# Patient Record
Sex: Male | Born: 1935 | Race: White | Hispanic: No | Marital: Married | Smoking: Never smoker
Health system: Southern US, Community
[De-identification: ages and names within clinical notes are randomized; demographics above are authoritative.]

## PROBLEM LIST (undated history)

## (undated) DIAGNOSIS — I774 Celiac artery compression syndrome: Secondary | ICD-10-CM

## (undated) DIAGNOSIS — I771 Stricture of artery: Secondary | ICD-10-CM

## (undated) DIAGNOSIS — E042 Nontoxic multinodular goiter: Secondary | ICD-10-CM

## (undated) HISTORY — DX: Nontoxic multinodular goiter: E04.2

## (undated) HISTORY — PX: TONSILLECTOMY AND ADENOIDECTOMY: SUR1326

## (undated) HISTORY — DX: Celiac artery compression syndrome: I77.4

## (undated) HISTORY — DX: Stricture of artery: I77.1

## (undated) HISTORY — PX: KNEE SURGERY: SHX244

## (undated) HISTORY — PX: HERNIA REPAIR: SHX51

---

## 2012-03-21 DIAGNOSIS — H3530 Unspecified macular degeneration: Secondary | ICD-10-CM | POA: Insufficient documentation

## 2012-09-14 DIAGNOSIS — R634 Abnormal weight loss: Secondary | ICD-10-CM | POA: Insufficient documentation

## 2012-11-19 DIAGNOSIS — D72819 Decreased white blood cell count, unspecified: Secondary | ICD-10-CM | POA: Insufficient documentation

## 2012-11-19 DIAGNOSIS — R109 Unspecified abdominal pain: Secondary | ICD-10-CM | POA: Insufficient documentation

## 2012-11-19 DIAGNOSIS — R413 Other amnesia: Secondary | ICD-10-CM | POA: Insufficient documentation

## 2012-11-19 DIAGNOSIS — R21 Rash and other nonspecific skin eruption: Secondary | ICD-10-CM | POA: Insufficient documentation

## 2012-11-19 DIAGNOSIS — L309 Dermatitis, unspecified: Secondary | ICD-10-CM | POA: Insufficient documentation

## 2013-10-18 DIAGNOSIS — K551 Chronic vascular disorders of intestine: Secondary | ICD-10-CM | POA: Insufficient documentation

## 2014-04-25 DIAGNOSIS — D649 Anemia, unspecified: Secondary | ICD-10-CM | POA: Insufficient documentation

## 2014-06-15 HISTORY — PX: SHOULDER SURGERY: SHX246

## 2014-08-26 DIAGNOSIS — M19019 Primary osteoarthritis, unspecified shoulder: Secondary | ICD-10-CM | POA: Insufficient documentation

## 2015-01-18 DIAGNOSIS — M75121 Complete rotator cuff tear or rupture of right shoulder, not specified as traumatic: Secondary | ICD-10-CM | POA: Insufficient documentation

## 2015-02-25 DIAGNOSIS — N401 Enlarged prostate with lower urinary tract symptoms: Secondary | ICD-10-CM | POA: Insufficient documentation

## 2015-04-29 DIAGNOSIS — N319 Neuromuscular dysfunction of bladder, unspecified: Secondary | ICD-10-CM | POA: Insufficient documentation

## 2015-04-29 DIAGNOSIS — R339 Retention of urine, unspecified: Secondary | ICD-10-CM | POA: Insufficient documentation

## 2015-05-31 DIAGNOSIS — M4316 Spondylolisthesis, lumbar region: Secondary | ICD-10-CM | POA: Insufficient documentation

## 2015-05-31 DIAGNOSIS — M47819 Spondylosis without myelopathy or radiculopathy, site unspecified: Secondary | ICD-10-CM | POA: Insufficient documentation

## 2015-05-31 DIAGNOSIS — M5136 Other intervertebral disc degeneration, lumbar region: Secondary | ICD-10-CM | POA: Insufficient documentation

## 2015-05-31 DIAGNOSIS — M51369 Other intervertebral disc degeneration, lumbar region without mention of lumbar back pain or lower extremity pain: Secondary | ICD-10-CM | POA: Insufficient documentation

## 2015-06-05 DIAGNOSIS — E042 Nontoxic multinodular goiter: Secondary | ICD-10-CM | POA: Insufficient documentation

## 2015-11-13 DIAGNOSIS — M7752 Other enthesopathy of left foot: Secondary | ICD-10-CM | POA: Insufficient documentation

## 2017-05-19 DIAGNOSIS — Z789 Other specified health status: Secondary | ICD-10-CM | POA: Insufficient documentation

## 2017-11-18 ENCOUNTER — Emergency Department (HOSPITAL_BASED_OUTPATIENT_CLINIC_OR_DEPARTMENT_OTHER): Payer: Medicare Other

## 2017-11-18 ENCOUNTER — Emergency Department (HOSPITAL_BASED_OUTPATIENT_CLINIC_OR_DEPARTMENT_OTHER)
Admission: EM | Admit: 2017-11-18 | Discharge: 2017-11-18 | Disposition: A | Discharge status: Home / Self Care | Payer: Medicare Other | Attending: Emergency Medicine | Admitting: Emergency Medicine

## 2017-11-18 ENCOUNTER — Other Ambulatory Visit: Payer: Self-pay

## 2017-11-18 ENCOUNTER — Encounter (HOSPITAL_BASED_OUTPATIENT_CLINIC_OR_DEPARTMENT_OTHER): Payer: Self-pay | Admitting: Emergency Medicine

## 2017-11-18 DIAGNOSIS — R0789 Other chest pain: Secondary | ICD-10-CM | POA: Diagnosis present

## 2017-11-18 LAB — BASIC METABOLIC PANEL
Anion gap: 6 (ref 5–15)
BUN: 20 mg/dL (ref 6–20)
CALCIUM: 8.7 mg/dL — AB (ref 8.9–10.3)
CHLORIDE: 105 mmol/L (ref 101–111)
CO2: 29 mmol/L (ref 22–32)
CREATININE: 1.07 mg/dL (ref 0.61–1.24)
Glucose, Bld: 117 mg/dL — ABNORMAL HIGH (ref 65–99)
Potassium: 4.5 mmol/L (ref 3.5–5.1)
SODIUM: 140 mmol/L (ref 135–145)

## 2017-11-18 LAB — CBC
HCT: 36.5 % — ABNORMAL LOW (ref 39.0–52.0)
Hemoglobin: 12.2 g/dL — ABNORMAL LOW (ref 13.0–17.0)
MCH: 32.1 pg (ref 26.0–34.0)
MCHC: 33.4 g/dL (ref 30.0–36.0)
MCV: 96.1 fL (ref 78.0–100.0)
PLATELETS: 129 10*3/uL — AB (ref 150–400)
RBC: 3.8 MIL/uL — AB (ref 4.22–5.81)
RDW: 13.2 % (ref 11.5–15.5)
WBC: 9.2 10*3/uL (ref 4.0–10.5)

## 2017-11-18 LAB — TROPONIN I

## 2017-11-18 LAB — D-DIMER, QUANTITATIVE (NOT AT ARMC): D DIMER QUANT: 0.44 ug{FEU}/mL (ref 0.00–0.50)

## 2017-11-18 MED ORDER — IOPAMIDOL (ISOVUE-300) INJECTION 61%
100.0000 mL | Freq: Once | INTRAVENOUS | Status: AC | PRN
  Administered 2017-11-18: 80 mL via INTRAVENOUS

## 2017-11-18 MED ORDER — SODIUM CHLORIDE 0.9 % IV BOLUS
500.0000 mL | Freq: Once | INTRAVENOUS | Status: AC
  Administered 2017-11-18: 500 mL via INTRAVENOUS

## 2017-11-18 NOTE — ED Notes (Signed)
ED Provider at bedside. 

## 2017-11-18 NOTE — ED Notes (Signed)
Patient transported to CT 

## 2017-11-18 NOTE — ED Triage Notes (Signed)
Patient reports he began having left sided chest pain at 0200 this morning.  Reports he lives at [facility] and was evaluated by EMTs.  States he preferred to wait until morning to be evaluated.  Reports at present pain is less than this morning but increases on inspiration.  Denies nausea/vomiting, shortness of breath.

## 2017-11-18 NOTE — ED Provider Notes (Signed)
[facility] EMERGENCY DEPARTMENT Provider Note   CSN: [id number] Arrival date & time: 11/18/17  7829     History   Chief Complaint Chief Complaint  Patient presents with  . Chest Pain    HPI Jerry Roth is a 82 y.o. male.  The history is provided by the patient and the spouse. No language interpreter was used.  Chest Pain      Jerry Roth is a 82 y.o. male who presents to the Emergency Department complaining of chest pain. He reports sudden onset left sided chest pain that woke him at 2 AM this morning. Pain is described as sharp in nature and worse with deep breath. When the pain woke him he walked down to the front desk at his [facility] facility and EMS arrived. He had an EKG performed at that time and was given for baby aspirin. He declined transfer to the emergency department and went back to his room and went to bed. When he woke this morning at 630 he did have persistent pain but he describes it now is dull in nature. He denies any associated fevers, shortness of breath, cough, nausea, vomiting, abdominal pain, leg swelling or pain. He has no known medical problems and takes no medications. He is a non-smoker. No recent surgeries.  History reviewed. No pertinent past medical history.  There are no active problems to display for this patient.   * The histories are not reviewed yet. Please review them in the "History" navigator section and refresh this SmartLink.      Home Medications    Prior to Admission medications   Medication Sig Start Date End Date Taking? Authorizing Provider  Cholecalciferol (VITAMIN D3 PO) Take by mouth.   Yes [provider]    Family History History reviewed. No pertinent family history.  Social History Social History   Tobacco Use  . Smoking status: Not on file  Substance Use Topics  . Alcohol use: Not on file  . Drug use: Not on file     Allergies   Shellfish allergy   Review of  Systems Review of Systems  Cardiovascular: Positive for chest pain.  All other systems reviewed and are negative.    Physical Exam Updated Vital Signs BP (!) 151/71   Pulse (!) 56   Temp 99.3 F (37.4 C) (Oral)   Resp (!) 21   Ht 6' (1.829 m)   Wt 60.3 kg (133 lb)   SpO2 100%   BMI 18.04 kg/m   Physical Exam  Constitutional: He is oriented to person, place, and time. He appears well-developed and well-nourished.  HENT:  Head: Normocephalic and atraumatic.  Cardiovascular: Normal rate and regular rhythm.  No murmur heard. Pulmonary/Chest: Effort normal and breath sounds normal. No respiratory distress. He exhibits no tenderness.  Abdominal: Soft. There is no tenderness. There is no rebound and no guarding.  Musculoskeletal: He exhibits no edema or tenderness.  Neurological: He is alert and oriented to person, place, and time.  Skin: Skin is warm and dry.  Psychiatric: He has a normal mood and affect. His behavior is normal.  Nursing note and vitals reviewed.    ED Treatments / Results  Labs (all labs ordered are listed, but only abnormal results are displayed) Labs Reviewed  BASIC METABOLIC PANEL - Abnormal; Notable for the following components:      Result Value   Glucose, Bld 117 (*)    Calcium 8.7 (*)    All other components within normal  limits  CBC - Abnormal; Notable for the following components:   RBC 3.80 (*)    Hemoglobin 12.2 (*)    HCT 36.5 (*)    Platelets 129 (*)    All other components within normal limits  TROPONIN I  D-DIMER, QUANTITATIVE (NOT AT [facility])  TROPONIN I    EKG EKG Interpretation  Date/Time:  Thursday November 18 2017 09:27:55 EDT Ventricular Rate:  72 PR Interval:    QRS Duration: 85 QT Interval:  394 QTC Calculation: 432 R Axis:   83 Text Interpretation:  Sinus rhythm Borderline right axis deviation RSR' in V1 or V2, probably normal variant no prior available for comparison Confirmed by Tilden Fossaees, Kristian Hazzard [phone]([id number]) on 11/18/2017  9:31:30 AM Also confirmed by Tilden Fossaees, Kennetta Pavlovic [phone]([id number]), editor Barbette Hairassel, Kerry [phone]([id number])  on 11/18/2017 10:42:33 AM   Radiology Dg Chest 2 View  Result Date: 11/18/2017 CLINICAL DATA:  Left-sided chest pain. EXAM: CHEST - 2 VIEW COMPARISON:  None. FINDINGS: Opacity in the right apex may be scarring but is indeterminate. Soft tissue thickening with associated calcification in the left apex is likely pleuroparenchymal change. No pneumothorax. The heart, hila, mediastinum are normal. Hyperinflation of the lungs identified. Pleural thickening versus tiny effusions. Pleural thickening favored. No other acute abnormalities. IMPRESSION: 1. Hyperinflation of the lungs consistent with COPD/emphysema. 2. Opacity in the right apex is age indeterminate. While this finding could represent scarring, there is a more nodular component. Recommend comparison to outside films if available. If none are available, recommend CT imaging for further assessment. 3. Increased soft tissue at the left apex with calcifications likely pleuroparenchymal change/scarring. Again, comparison to old films would be beneficial. Electronically Signed   By: Gerome Samavid  Williams III M.D   On: 11/18/2017 09:52   Ct Chest W Contrast  Result Date: 11/18/2017 CLINICAL DATA:  Chest pain.  Abnormal chest radiograph EXAM: CT CHEST WITH CONTRAST TECHNIQUE: Multidetector CT imaging of the chest was performed during intravenous contrast administration. CONTRAST:  80mL ISOVUE-300 IOPAMIDOL (ISOVUE-300) INJECTION 61% COMPARISON:  Chest radiograph November 18, 2017 FINDINGS: Cardiovascular: There is no demonstrable thoracic aortic aneurysm or dissection. The visualized great vessels appear normal except for slight calcification in the right proximal subclavian artery. There is aortic atherosclerosis. There are foci of coronary artery calcification. No pericardial pericardial thickening evident. A small amount of pericardial fluid is felt to be within physiologic limits.  Mediastinum/Nodes: There are nodular opacities throughout a rather prominent right lobe of the thyroid, largest measuring 2.0 x 1.7 cm. There is no appreciable thoracic adenopathy. No esophageal lesions are demonstrable. There is moderate air in the esophagus. Lungs/Pleura: There is apical pleural thickening with calcification along the apices bilaterally. There is slightly more scarring on the right than on the left. There is no appreciable nodular lesion in either apex region. There is mild atelectasis in each lower lobe. There is a ground-glass type opacity in the inferior lingula which abuts the anterior pleura on the left inferiorly as well as the left pericardium. This ground-glass type opacity measures 3.1 x 2.5 x 2.3 cm. In the inferior aspect of this area of ground-glass opacity, there is a 6 x 6 mm nodular component. No similar changes are evident elsewhere. No frank consolidation. No pleural effusion evident. Note that there is felt to be a degree of underlying emphysematous change. Upper Abdomen: There is atherosclerotic calcification in the abdominal aorta and visualized great vessels. There is incomplete visualization of a cystic structure in the right upper quadrant which likely arises from  the upper pole of the right kidney. This cystic area measures 3.1 x 1.8 cm. Visualized upper abdominal structures otherwise appear unremarkable. Musculoskeletal: There are no blastic or lytic bone lesions. No evident chest wall lesions. IMPRESSION: 1. There is a ground-glass type opacity in the inferior lingula measuring 3.1 x 2.5 x 2.3 cm. There is a 6 mm nodular component within the inferior aspect of this lesion. Follow-up non-contrast CT recommended at 3-6 months to confirm persistence. If unchanged, and solid component remains <6 mm, annual CT is recommended until 5 years of stability has been established. If persistent these nodules should be considered highly suspicious if the solid component of the nodule  is 6 mm or greater in size and enlarging. This recommendation follows the consensus statement: Guidelines for Management of Incidental Pulmonary Nodules Detected on CT Images: From the Fleischner Society 2017; Radiology 2017; 284:228-243. 2. Apical scarring with apical thickening and calcification, likely of inflammatory etiology. No nodular appearing or masslike area seen in either apex. 3.  There is a degree of underlying emphysematous change. 4.  No evident thoracic adenopathy. 5. **An incidental finding of potential clinical significance has been found. Nodular lesions in the right lobe of the thyroid with a dominant lesion measuring 2.0 x 1.7 cm. Consider further evaluation with thyroid ultrasound. If patient is clinically hyperthyroid, consider nuclear medicine thyroid uptake and scan.** 6. Aortic atherosclerosis. No thoracic aortic aneurysm or dissection. No major vessel pulmonary embolus evident. There are foci of coronary artery calcification. 7. Small amount of pericardial effusion, likely within physiologic range. Aortic Atherosclerosis (ICD10-I70.0) and Emphysema (ICD10-J43.9). Electronically Signed   By: Bretta Bang III M.D.   On: 11/18/2017 12:11    Procedures Procedures (including critical care time)  Medications Ordered in ED Medications  sodium chloride 0.9 % bolus 500 mL (0 mLs Intravenous Stopped 11/18/17 1239)  iopamidol (ISOVUE-300) 61 % injection 100 mL (80 mLs Intravenous Contrast Given 11/18/17 1141)     Initial Impression / Assessment and Plan / ED Course  I have reviewed the triage vital signs and the nursing notes.  Pertinent labs & imaging results that were available during my care of the patient were reviewed by me and considered in my medical decision making (see chart for details).     Patient here for evaluation of left sided chest pain that is pleuritic in nature. He is non-toxic appearing on examination with no respiratory distress. Pain is not reproducible on  exam. Current presentation is not consistent with PE, ACS, dissection. He does have some nodules on his chest x-ray, with no priors available for comparison. CT chest was obtained to further evaluate. CT does demonstrate pulmonary nodules with no evidence of acute pneumonia. Discussed with patient home care for chest pain, pulmonary nodules. Patient declined any pain medications in the department, recommend Tylenol PRN pain at home. Discussed importance of PCP follow-up for follow-up of his pulmonary nodule and thyroid nodule. Discussed importance of cardiology follow-up for recheck regarding his chest pain. Return precautions discussed. Final Clinical Impressions(s) / ED Diagnoses   Final diagnoses:  Atypical chest pain    ED Discharge Orders    None       Tilden Fossa, MD 11/18/17 [phone]

## 2017-11-18 NOTE — Discharge Instructions (Signed)
You had a CT scan of your chest today that showed a lung nodule.  It is important for you to follow up with your doctor for recheck and repeat imaging in the next 3-6 months.

## 2017-11-18 NOTE — ED Notes (Signed)
Patient transported to X-ray 

## 2018-02-11 ENCOUNTER — Encounter

## 2018-02-11 ENCOUNTER — Ambulatory Visit (INDEPENDENT_AMBULATORY_CARE_PROVIDER_SITE_OTHER): Payer: Medicare Other | Admitting: Cardiovascular Disease

## 2018-02-11 ENCOUNTER — Encounter: Payer: Self-pay | Admitting: Cardiovascular Disease

## 2018-02-11 VITALS — BP 80/54 | HR 64 | Ht 72.0 in | Wt 135.0 lb

## 2018-02-11 DIAGNOSIS — R072 Precordial pain: Secondary | ICD-10-CM

## 2018-02-11 DIAGNOSIS — I774 Celiac artery compression syndrome: Secondary | ICD-10-CM

## 2018-02-11 DIAGNOSIS — I25118 Atherosclerotic heart disease of native coronary artery with other forms of angina pectoris: Secondary | ICD-10-CM | POA: Diagnosis not present

## 2018-02-11 DIAGNOSIS — I771 Stricture of artery: Secondary | ICD-10-CM | POA: Insufficient documentation

## 2018-02-11 DIAGNOSIS — R012 Other cardiac sounds: Secondary | ICD-10-CM

## 2018-02-11 DIAGNOSIS — I2510 Atherosclerotic heart disease of native coronary artery without angina pectoris: Secondary | ICD-10-CM | POA: Insufficient documentation

## 2018-02-11 DIAGNOSIS — I700 Atherosclerosis of aorta: Secondary | ICD-10-CM

## 2018-02-11 DIAGNOSIS — I959 Hypotension, unspecified: Secondary | ICD-10-CM

## 2018-02-11 DIAGNOSIS — R636 Underweight: Secondary | ICD-10-CM | POA: Diagnosis not present

## 2018-02-11 MED ORDER — METOPROLOL TARTRATE 50 MG PO TABS: 50.0000 mg | ORAL_TABLET | Freq: Once | ORAL | 0 refills | Status: DC

## 2018-02-11 NOTE — Patient Instructions (Addendum)
Medication Instructions: Dr Royann Shiversroitoru recommends that you continue on your current medications as directed. Please refer to the Current Medication list given to you today.  Labwork: Your physician recommends that you return for lab work prior to your cardiac CT.  Testing/Procedures: 1. Cardiac CT - Your physician has requested that you have cardiac CT. Cardiac computed tomography (CT) is a painless test that uses an x-ray machine to take clear, detailed pictures of your heart. For further information please visit [email or web address] Please follow instruction sheet as given. >> This will be performed at [facility] [address] [place] [address] [phone]  2. Abdominal Aorta Ultrasound - Your physician has requested that you have an abdominal aorta duplex. During this test, an ultrasound is used to evaluate the aorta. Allow 30 minutes for this exam. Do not eat after midnight the day before and avoid carbonated beverages. >> This will be performed at our [facility] location [address], [address] [place] [address] [phone]  3. Echocardiogram - Your physician has requested that you have an echocardiogram. Echocardiography is a painless test that uses sound waves to create images of your heart. It provides your doctor with information about the size and shape of your heart and how well your heart's chambers and valves are working. This procedure takes approximately one hour. There are no restrictions for this procedure. >> This will be performed at our [facility] [address], [address] [place] [address] [phone]  Follow-up: Dr Royann Shiversroitoru recommends that you schedule a follow-up appointment first available.  If you need a refill on your cardiac medications before your next appointment, please call your pharmacy.   Please arrive at the [facility] main entrance of [facility] at ________ AM (30-45 minutes prior to test start  time)  [facility] [address] [place], [place] [address] [phone]  Proceed to the [facility] Radiology Department (First Floor).  Please follow these instructions carefully (unless otherwise directed):  Hold all erectile dysfunction medications at least 48 hours prior to test.  On the Night Before the Test: . Drink plenty of water. . Do not consume any caffeinated/decaffeinated beverages or chocolate 12 hours prior to your test. . Do not take any antihistamines 12 hours prior to your test.  On the Day of the Test: . Drink plenty of water. Do not drink any water within one hour of the test. . Do not eat any food 4 hours prior to the test. . You may take your regular medications prior to the test. . IF NOT ON A BETA BLOCKER - Take 50 mg of lopressor (metoprolol) one hour before the test. . HOLD Furosemide morning of the test.  After the Test: . Drink plenty of water. . After receiving IV contrast, you may experience a mild flushed feeling. This is normal. . On occasion, you may experience a mild rash up to 24 hours after the test. This is not dangerous. If this occurs, you can take Benadryl 25 mg and increase your fluid intake. . If you experience trouble breathing, this can be serious. If it is severe call 911 IMMEDIATELY. If it is mild, please call our office. . If you take any of these medications: Glipizide/Metformin, Avandament, Glucavance, please do not take 48 hours after completing test.

## 2018-02-11 NOTE — Progress Notes (Signed)
Cardiology Office Note:    Date:  02/11/2018   ID:  Jerry Roth, DOB April 24, 1936, MRN [id number]  PCP:  Nadara EatonPiazza, Michael J, MD  Cardiologist:  No primary care provider on file.   Referring MD: Tilden Fossaees, Elizabeth, MD   Chief Complaint  Patient presents with  . Chest Pain    once in June    History of Present Illness:    Jerry Roth is a 82 y.o. male with a hx of celiac artery stenosis who was evaluated for an episode of chest pain in June.  Chest discomfort was retrosternal and woke him up at 2 AM.  It went on for 4 hours after which he fell asleep.  When he woke he was no longer having any symptoms.  It was moderate in severity and had a dull achy sensation.  Was evaluated at [facility] the next day.  ECG was normal.  Enzymes were likewise normal.  CT scan of the chest with contrast showed normal caliber aorta with aortic atherosclerosis and foci of coronary artery calcification as well as some scattered areas of groundglass pulmonary opacities on a background of emphysematous change, incidentally noted 2 cm right thyroid nodule (benign follicular tissue by previous biopsy).  D-dimer was low.  No evidence of AAA on previous CT abdomen  Diagnosis of celiac artery stenosis occurred following evaluation for abdominal pain and weight loss.  celiac artery stenosis (preprandial velocity 4.25 m/s, maximal postprandial velocity 4.55 m/s -see notes from Dr. Hoover Brunetteruz/ Matthew Eveland, PA-C, [facility], [place], November 27, 2016).  Conservative management is recommended.  He does not have intestinal angina or diarrhea.  He does have excessive "gas".  He reports eating "like a horse" including having a penchant for sweets (his wife confirms his high intake of food), but despite that he continues to lose weight.  He swims half a mile and 40 minutes 3 days a week.  He is short of breath at the end of that exertion, but never has chest discomfort.  He denies dizziness or  syncope.  He has not been aware of any palpitations.  He reports that he has had a relatively low blood pressure his whole life, typically in the 100/60 range.  He reports 10 pound weight loss over the last 7 years.  He is unable to gain any weight back.  Lipid profile from November 2017 shows a total cholesterol 197, triglycerides 53, HDL 77, LDL 106.  Is originally from [place] and is currently a resident at the [facility].  There is no family history of coronary disease.  His father had a cardiac arrest the after undergoing cholecystectomy.  His sister underwent surgery for 2 heart valves (1 repaired one replaced) about a year ago.  Past Medical History:  Diagnosis Date  . Celiac artery stenosis (HCC)   . Multiple thyroid nodules    Benign on biopsy    Past Surgical History:  Procedure Laterality Date  . HERNIA REPAIR     x3  . HERNIA REPAIR     x 3  . KNEE SURGERY    . SHOULDER SURGERY    . SHOULDER SURGERY Right 2016  . TONSILLECTOMY AND ADENOIDECTOMY     82 yrs old  . TONSILLECTOMY AND ADENOIDECTOMY     Childhood    Current Medications: Current Meds  Medication Sig  . Cholecalciferol (VITAMIN D3 PO) Take 2,000 Units by mouth daily.      Allergies:   Shellfish  allergy   Social History   Socioeconomic History  . Marital status: Married    Spouse name: Not on file  . Number of children: Not on file  . Years of education: Not on file  . Highest education level: Not on file  Occupational History  . Not on file  Social Needs  . Financial resource strain: Not on file  . Food insecurity:    Worry: Not on file    Inability: Not on file  . Transportation needs:    Medical: Not on file    Non-medical: Not on file  Tobacco Use  . Smoking status: Never Smoker  . Smokeless tobacco: Never Used  Substance and Sexual Activity  . Alcohol use: Yes    Alcohol/week: 2.0 standard drinks    Types: 2 Glasses of wine per week  . Drug use: Never  . Sexual  activity: Not on file  Lifestyle  . Physical activity:    Days per week: Not on file    Minutes per session: Not on file  . Stress: Not on file  Relationships  . Social connections:    Talks on phone: Not on file    Gets together: Not on file    Attends religious service: Not on file    Active member of club or organization: Not on file    Attends meetings of clubs or organizations: Not on file    Relationship status: Not on file  Other Topics Concern  . Not on file  Social History Narrative  . Not on file     Family History: The patient's family history includes Cancer - Colon in his mother; Gallbladder disease in his father; Heart attack in his father; Hypertension in his father and mother.  ROS:   Please see the history of present illness.     All other systems reviewed and are negative.  EKGs/Labs/Other Studies Reviewed:    The following studies were reviewed today: Notes from the emergency room visit June 2019, notes from vascular surgeon June 2018  EKG:  EKG is ordered today.  The ekg ordered today demonstrates 1, normal tracing  Recent Labs: 11/18/2017: BUN 20; Creatinine, Ser 1.07; Hemoglobin 12.2; Platelets 129; Potassium 4.5; Sodium 140  Recent Lipid Panel 2017: Total cholesterol 197, triglycerides 53, HDL 77, LDL 106  Physical Exam:    VS:  BP (!) 80/54   Pulse 64   Ht 6' (1.829 m)   Wt 135 lb (61.2 kg)   BMI 18.31 kg/m    Blood pressure right arm 81/62, left arm 88/61  Wt Readings from Last 3 Encounters:  02/11/18 135 lb (61.2 kg)  11/18/17 133 lb (60.3 kg)     GEN: Extremely lean/under nourished, well developed in no acute distress HEENT: Normal NECK: No JVD; No carotid bruits LYMPHATICS: No lymphadenopathy CARDIAC: Distinct additional heart sound is heard, S4 versus a systolic click, RRR, no murmurs, rubs, gallops RESPIRATORY:  Clear to auscultation without rales, wheezing or rhonchi  ABDOMEN: Soft, non-tender, non-distended MUSCULOSKELETAL:   No edema; No deformity  SKIN: Warm and dry NEUROLOGIC:  Alert and oriented x 3 PSYCHIATRIC:  Normal affect   ASSESSMENT:    1. Precordial chest pain   2. Hypotension, unspecified hypotension type   3. Celiac artery stenosis (HCC)   4. Severely underweight adult   5. Abnormal heart sounds   6. Atherosclerosis of native coronary artery of native heart with other form of angina pectoris (HCC)   7. Atherosclerosis of aorta (  HCC)    PLAN:    In order of problems listed above:  1. Chest pain: Nocturnal episode of chest pain may have represented unstable angina, but cardiac enzymes and ECG were normal the next day and he has not had any events since.  There is substantial evidence of coronary atherosclerosis on imaging studies.  I recommended a coronary CT angiogram.  Has a naturally slow heart rate and is extremely lean so should make for very good coronary angiogram images. 2. Hypotension: There is no evidence of subclavian stenosis by physical exam, he does not have subclavian bruit so I assume this is his true blood pressure.  He is tolerating it fine without any symptoms.  He has good functional status.  I think we will have to forego the typical dose of metoprolol before his CT scan to avoid symptomatic worsening of his hypotension. 3. Celiac artery stenosis: Need to reevaluate this and look for evidence of superior mesenteric stenosis as well.  He has lost so much weight that malabsorption due to intestinal ischemia is a compelling thought, even in the absence of intestinal angina.  Scheduled for an ultrasound.  Again, the images should be of good quality in view of his body habitus.  Consider referral to GI to evaluate for malabsorption. 4. Underweight: Multiple thyroid assays have been normal.  Malabsorption and thyrotoxicosis would be the leading diagnostic contenders for weight loss despite good caloric intake.  Might have to consider natural calorie count, but both the patient and his wife  agree that his intake of food is higher than average. 5. Abnormal heart sounds: Scheduled for echocardiogram. 6. Coronary and aortic atherosclerosis: seen on CT. Get lipids from PCP.   Medication Adjustments/Labs and Tests Ordered: Current medicines are reviewed at length with the patient today.  Concerns regarding medicines are outlined above.  Orders Placed This Encounter  Procedures  . CT CORONARY MORPH W/CTA COR W/SCORE W/CA W/CM &/OR WO/CM  . CT CORONARY FRACTIONAL FLOW RESERVE DATA PREP  . CT CORONARY FRACTIONAL FLOW RESERVE FLUID ANALYSIS  . Basic metabolic panel  . EKG 12-Lead  . ECHOCARDIOGRAM COMPLETE   Meds ordered this encounter  Medications  . DISCONTD: metoprolol tartrate (LOPRESSOR) 50 MG tablet    Sig: Take 1 tablet (50 mg total) by mouth once for 1 dose. 1 hour before cardiac CT.    Dispense:  1 tablet    Refill:  0    Patient Instructions  Medication Instructions: Dr Royann Shivers recommends that you continue on your current medications as directed. Please refer to the Current Medication list given to you today.  Labwork: Your physician recommends that you return for lab work prior to your cardiac CT.  Testing/Procedures: 1. Cardiac CT - Your physician has requested that you have cardiac CT. Cardiac computed tomography (CT) is a painless test that uses an x-ray machine to take clear, detailed pictures of your heart. For further information please visit [email or web address] Please follow instruction sheet as given. >> This will be performed at [facility] [address] [place] [address] [phone]  2. Abdominal Aorta Ultrasound - Your physician has requested that you have an abdominal aorta duplex. During this test, an ultrasound is used to evaluate the aorta. Allow 30 minutes for this exam. Do not eat after midnight the day before and avoid carbonated beverages. >> This will be performed at our [facility] location [address], [address] [place] [address] [phone]  3. Echocardiogram - Your physician has  requested that you have an echocardiogram. Echocardiography is a painless test that uses sound waves to create images of your heart. It provides your doctor with information about the size and shape of your heart and how well your heart's chambers and valves are working. This procedure takes approximately one hour. There are no restrictions for this procedure. >> This will be performed at our [facility] location [address] [place], [address] [place] [address] [phone]  Follow-up: Dr Royann Shivers recommends that you schedule a follow-up appointment first available.  If you need a refill on your cardiac medications before your next appointment, please call your pharmacy.   Please arrive at the [facility] main entrance of [facility] at ________ AM (30-45 minutes prior to test start time)  [facility] [address] [place], [place] [address] [phone]  Proceed to the [facility] Radiology Department (First Floor).  Please follow these instructions carefully (unless otherwise directed):  Hold all erectile dysfunction medications at least 48 hours prior to test.  On the Night Before the Test: . Drink plenty of water. . Do not consume any caffeinated/decaffeinated beverages or chocolate 12 hours prior to your test. . Do not take any antihistamines 12 hours prior to your test.  On the Day of the Test: . Drink plenty of water. Do not drink any water within one hour of the test. . Do not eat any food 4 hours prior to the test. . You may take your regular medications prior to the test. . IF NOT ON A BETA BLOCKER - Take 50 mg of lopressor (metoprolol) one hour before the test. . HOLD Furosemide morning of the test.  After the Test: . Drink plenty of water. . After receiving IV contrast, you may experience a mild flushed feeling. This is normal. . On occasion, you may experience a  mild rash up to 24 hours after the test. This is not dangerous. If this occurs, you can take Benadryl 25 mg and increase your fluid intake. . If you experience trouble breathing, this can be serious. If it is severe call 911 IMMEDIATELY. If it is mild, please call our office. . If you take any of these medications: Glipizide/Metformin, Avandament, Glucavance, please do not take 48 hours after completing test.     Signed, Thurmon Fair, MD  02/11/2018 5:55 PM    [facility]\

## 2018-02-23 ENCOUNTER — Ambulatory Visit (HOSPITAL_COMMUNITY): Payer: Medicare Other | Attending: Cardiovascular Disease

## 2018-02-23 ENCOUNTER — Other Ambulatory Visit: Payer: Self-pay

## 2018-02-23 DIAGNOSIS — R012 Other cardiac sounds: Secondary | ICD-10-CM | POA: Diagnosis not present

## 2018-02-23 DIAGNOSIS — I959 Hypotension, unspecified: Secondary | ICD-10-CM | POA: Diagnosis not present

## 2018-02-23 DIAGNOSIS — I774 Celiac artery compression syndrome: Secondary | ICD-10-CM | POA: Insufficient documentation

## 2018-02-23 DIAGNOSIS — I081 Rheumatic disorders of both mitral and tricuspid valves: Secondary | ICD-10-CM | POA: Insufficient documentation

## 2018-02-28 ENCOUNTER — Encounter (HOSPITAL_COMMUNITY): Payer: Medicare Other

## 2018-03-01 ENCOUNTER — Ambulatory Visit (HOSPITAL_COMMUNITY)
Admission: RE | Admit: 2018-03-01 | Discharge: 2018-03-01 | Disposition: A | Discharge status: Home / Self Care | Payer: Medicare Other | Source: Ambulatory Visit | Attending: Cardiology | Admitting: Cardiology

## 2018-03-01 DIAGNOSIS — I774 Celiac artery compression syndrome: Secondary | ICD-10-CM | POA: Diagnosis not present

## 2018-03-01 DIAGNOSIS — I771 Stricture of artery: Secondary | ICD-10-CM

## 2018-03-01 LAB — BASIC METABOLIC PANEL
BUN/Creatinine Ratio: 18 (ref 10–24)
BUN: 19 mg/dL (ref 8–27)
CALCIUM: 9.7 mg/dL (ref 8.6–10.2)
CO2: 22 mmol/L (ref 20–29)
CREATININE: 1.06 mg/dL (ref 0.76–1.27)
Chloride: 103 mmol/L (ref 96–106)
GFR calc Af Amer: 76 mL/min/{1.73_m2} (ref >59)
GFR calc non Af Amer: 66 mL/min/{1.73_m2} (ref >59)
Glucose: 79 mg/dL (ref 65–99)
Potassium: 4.4 mmol/L (ref 3.5–5.2)
SODIUM: 142 mmol/L (ref 134–144)

## 2018-03-09 ENCOUNTER — Encounter (HOSPITAL_COMMUNITY): Payer: Self-pay | Admitting: Cardiology

## 2018-03-29 ENCOUNTER — Telehealth: Payer: Self-pay | Admitting: Cardiovascular Disease

## 2018-03-29 NOTE — Telephone Encounter (Signed)
Agree with holding the metoprolol due to low BP. HR was in low 60s. No need for premed with shellfish allergy (no cross-reaction with the current contrast we use). He will be getting exactly the same contrast he received for his chest CT on June 6. If no reaction then, should also be OK now. MCr

## 2018-03-29 NOTE — Telephone Encounter (Signed)
Schedule for 03-30-18  Cardiac Ct.  Need to discuss taking the Metoprolol.  Patient had several question about the med.

## 2018-03-29 NOTE — Telephone Encounter (Signed)
Left MD's message on pt's private voicemail that he did not need lopressor nor contrast dye prophy and to call back if he had any further questions

## 2018-03-29 NOTE — Telephone Encounter (Signed)
Patient understands he will NOT take Lopressor as he has hypotension.   He is unclear if he needs to take any contrast dye prophy as he has iodine allergy.      Please clarify, thank you

## 2018-03-29 NOTE — Telephone Encounter (Signed)
Left message to call back-cc 

## 2018-03-29 NOTE — Telephone Encounter (Signed)
New Message ° ° ° ° ° ° ° ° ° °Patient returned your call °

## 2018-03-30 ENCOUNTER — Ambulatory Visit (HOSPITAL_COMMUNITY)
Admission: RE | Admit: 2018-03-30 | Discharge: 2018-03-30 | Disposition: A | Discharge status: Home / Self Care | Payer: Medicare Other | Source: Ambulatory Visit | Attending: Cardiovascular Disease | Admitting: Cardiovascular Disease

## 2018-03-30 DIAGNOSIS — I700 Atherosclerosis of aorta: Secondary | ICD-10-CM | POA: Insufficient documentation

## 2018-03-30 DIAGNOSIS — R072 Precordial pain: Secondary | ICD-10-CM

## 2018-03-30 DIAGNOSIS — I2510 Atherosclerotic heart disease of native coronary artery without angina pectoris: Secondary | ICD-10-CM | POA: Insufficient documentation

## 2018-03-30 DIAGNOSIS — R918 Other nonspecific abnormal finding of lung field: Secondary | ICD-10-CM | POA: Diagnosis not present

## 2018-03-30 MED ORDER — NITROGLYCERIN 0.4 MG SL SUBL
SUBLINGUAL_TABLET | SUBLINGUAL | Status: AC
  Administered 2018-03-30: 0.8 mg
  Filled 2018-03-30: qty 2

## 2018-03-30 MED ORDER — NITROGLYCERIN 0.4 MG SL SUBL: 0.8000 mg | SUBLINGUAL_TABLET | Freq: Once | SUBLINGUAL | Status: DC

## 2018-03-30 MED ORDER — IOPAMIDOL (ISOVUE-370) INJECTION 76%
100.0000 mL | Freq: Once | INTRAVENOUS | Status: AC | PRN
  Administered 2018-03-30: 100 mL via INTRAVENOUS

## 2018-03-31 DIAGNOSIS — R072 Precordial pain: Secondary | ICD-10-CM

## 2018-04-18 ENCOUNTER — Ambulatory Visit (INDEPENDENT_AMBULATORY_CARE_PROVIDER_SITE_OTHER): Payer: Medicare Other | Admitting: Cardiovascular Disease

## 2018-04-18 ENCOUNTER — Encounter: Payer: Self-pay | Admitting: Cardiovascular Disease

## 2018-04-18 VITALS — BP 108/60 | HR 54 | Ht 72.0 in | Wt 133.0 lb

## 2018-04-18 DIAGNOSIS — I771 Stricture of artery: Secondary | ICD-10-CM

## 2018-04-18 DIAGNOSIS — I25118 Atherosclerotic heart disease of native coronary artery with other forms of angina pectoris: Secondary | ICD-10-CM | POA: Diagnosis not present

## 2018-04-18 DIAGNOSIS — I340 Nonrheumatic mitral (valve) insufficiency: Secondary | ICD-10-CM | POA: Insufficient documentation

## 2018-04-18 DIAGNOSIS — R931 Abnormal findings on diagnostic imaging of heart and coronary circulation: Secondary | ICD-10-CM | POA: Diagnosis not present

## 2018-04-18 DIAGNOSIS — I774 Celiac artery compression syndrome: Secondary | ICD-10-CM | POA: Diagnosis not present

## 2018-04-18 DIAGNOSIS — I341 Nonrheumatic mitral (valve) prolapse: Secondary | ICD-10-CM

## 2018-04-18 DIAGNOSIS — E7800 Pure hypercholesterolemia, unspecified: Secondary | ICD-10-CM

## 2018-04-18 DIAGNOSIS — E041 Nontoxic single thyroid nodule: Secondary | ICD-10-CM | POA: Insufficient documentation

## 2018-04-18 DIAGNOSIS — R911 Solitary pulmonary nodule: Secondary | ICD-10-CM

## 2018-04-18 DIAGNOSIS — I959 Hypotension, unspecified: Secondary | ICD-10-CM

## 2018-04-18 DIAGNOSIS — R636 Underweight: Secondary | ICD-10-CM

## 2018-04-18 NOTE — H&P (View-Only) (Signed)
Cardiology Office Note:    Date:  04/18/2018   ID:  Jerry Roth, DOB April 29, 1936, MRN [id number]  PCP:  Jerry Eaton, MD  Cardiologist:  No primary care provider on file.   Referring MD: Jerry Eaton, MD   Chief Complaint  Patient presents with  . Coronary Artery Disease    History of Present Illness:    Jerry Roth is a 82 y.o. male with a hx of celiac artery stenosis who was evaluated for an episode of chest pain in June.    He underwent a coronary CT angiogram that showed the presence of a severe stenosis in the proximal large oblique marginal branch of the circumflex coronary artery.  Fractional flow reserve this appears to be a significant stenosis.  Fractional flow reserve also shows diminished flow in the distal LAD, likely has a clear relative result of multiple minor stenoses/diffuse disease.  His calcium score is elevated, placing him in the 60th percentile for his age and gender.  Incidental findings which were also described on his noncardiac CT from June include the area of ground glass opacities with some nodularity in the lingula, bilateral apical lung scarring and a 2 cm nodule in the right lobe of the thyroid.  As far as I can tell these are unchanged, nonprogressive findings.  Jerry Roth also underwent a duplex ultrasound of the abdominal aorta that confirms a severe stenosis of the celiac artery with a widely patent superior mesenteric artery and inferior mesenteric artery and no evidence of renal artery stenosis on either side.  He continues to slowly lose weight.  His BMI is now 18.0.  This is despite the fact that he reports eating a healthy rich diet.  Lipid profile from November 2017 shows a total cholesterol 197, triglycerides 53, HDL 77, LDL 106.  He is originally from [place] and is currently a resident at the [facility] retirement facility with his wife.  They may move to the [place] area next spring.  There is no family  history of coronary disease.  His father had a cardiac arrest after undergoing cholecystectomy.  His sister underwent surgery for 2 heart valves (1 repaired, 1 replaced) about a year ago.  Past Medical History:  Diagnosis Date  . Celiac artery stenosis (HCC)   . Multiple thyroid nodules    Benign on biopsy    Past Surgical History:  Procedure Laterality Date  . HERNIA REPAIR     x3  . HERNIA REPAIR     x 3  . KNEE SURGERY    . SHOULDER SURGERY    . SHOULDER SURGERY Right 2016  . TONSILLECTOMY AND ADENOIDECTOMY     82 yrs old  . TONSILLECTOMY AND ADENOIDECTOMY     Childhood    Current Medications: Current Meds  Medication Sig  . Cholecalciferol (VITAMIN D3 PO) Take 2,000 Units by mouth daily.      Allergies:   Shellfish allergy   Social History   Socioeconomic History  . Marital status: Married    Spouse name: Not on file  . Number of children: Not on file  . Years of education: Not on file  . Highest education level: Not on file  Occupational History  . Not on file  Social Needs  . Financial resource strain: Not on file  . Food insecurity:    Worry: Not on file    Inability: Not on file  . Transportation needs:    Medical: Not on file    Non-medical:  Not on file  Tobacco Use  . Smoking status: Never Smoker  . Smokeless tobacco: Never Used  Substance and Sexual Activity  . Alcohol use: Yes    Alcohol/week: 2.0 standard drinks    Types: 2 Glasses of wine per week  . Drug use: Never  . Sexual activity: Not on file  Lifestyle  . Physical activity:    Days per week: Not on file    Minutes per session: Not on file  . Stress: Not on file  Relationships  . Social connections:    Talks on phone: Not on file    Gets together: Not on file    Attends religious service: Not on file    Active member of club or organization: Not on file    Attends meetings of clubs or organizations: Not on file    Relationship status: Not on file  Other Topics Concern  . Not  on file  Social History Narrative  . Not on file     Family History: The patient's family history includes Cancer - Colon in his mother; Gallbladder disease in his father; Heart attack in his father; Hypertension in his father and mother.  ROS:   Please see the history of present illness.    All other systems are reviewed and are negative EKGs/Labs/Other Studies Reviewed:    The following studies were reviewed today: Notes from the emergency room visit June 2019, notes from vascular surgeon June 2018  EKG:  EKG is ordered today.  The ekg ordered today shows sinus bradycardia, normal tracing, QTC 490 ms, no repolarization abnormalities  Recent Labs: 11/18/2017: Hemoglobin 12.2; Platelets 129 03/01/2018: BUN 19; Creatinine, Ser 1.06; Potassium 4.4; Sodium 142  Recent Lipid Panel 2017: Total cholesterol 197, triglycerides 53, HDL 77, LDL 106  Physical Exam:    VS:  BP 108/60   Pulse (!) 54   Ht 6' (1.829 m)   Wt 133 lb (60.3 kg)   BMI 18.04 kg/m      Wt Readings from Last 3 Encounters:  04/18/18 133 lb (60.3 kg)  02/11/18 135 lb (61.2 kg)  11/18/17 133 lb (60.3 kg)     General: Alert, oriented x3, no distress, Underweight/well-nourished Head: no evidence of trauma, PERRL, EOMI, no exophtalmos or lid lag, no myxedema, no xanthelasma; normal ears, nose and oropharynx Neck: normal jugular venous pulsations and no hepatojugular reflux; brisk carotid pulses without delay and no carotid bruits Chest: clear to auscultation, no signs of consolidation by percussion or palpation, normal fremitus, symmetrical and full respiratory excursions Cardiovascular: normal position and quality of the apical impulse, regular rhythm, normal first and second heart sounds, apical mid-systolic click followed by a short late systolic murmur, both intensified with a Valsalva maneuver, no rubs or gallops Abdomen: no tenderness or distention, no masses by palpation, no abnormal pulsatility or arterial  bruits, normal bowel sounds, no hepatosplenomegaly Extremities: no clubbing, cyanosis or edema; 2+ radial, ulnar and brachial pulses bilaterally; 2+ right femoral, posterior tibial and dorsalis pedis pulses; 2+ left femoral, posterior tibial and dorsalis pedis pulses; no subclavian or femoral bruits Neurological: grossly nonfocal Psych: Normal mood and affect   ASSESSMENT:    1. Atherosclerosis of native coronary artery of native heart with other form of angina pectoris (HCC)   2. Abnormal findings on diagnostic imaging of heart and coronary circulation   3. Hypotension, unspecified hypotension type   4. Celiac artery stenosis (HCC)   5. Severely underweight adult   6. Right thyroid nodule  7. Non-rheumatic mitral regurgitation   8. Hypercholesterolemia   9. Pulmonary nodule, left   10. Mitral valve prolapse    PLAN:    In order of problems listed above:  1. CAD: His CT shows above average burden of coronary atherosclerosis and evidence of a severe stenosis in a large oblique marginal artery.  He had an episode of angina at rest of few weeks ago.  In addition he has evidence of apical ischemia, probably due to diffuse disease in the LAD artery.  He has preserved left ventricular systolic function.  I have recommended that he undergo coronary angiography and possible placement of a stent in the OM artery.  We discussed the pros and cons of this procedure, alternative therapies, possible complications, the need for long-term dual antiplatelet therapy if he receives a stent (6 months).  This procedure has been fully reviewed with the patient and written informed consent has been obtained. His heart rate does not allow treatment with beta-blockers.  She start taking aspirin 81 mg once daily.  We will recheck his lipid profile.  Target LDL cholesterol less than 70.  He should be reevaluated since he has lost so much weight. 2. Hypotension: Compared to his last appointment, his blood pressure is  now normal.  There is no evidence of subclavian stenosis by physical exam, his last appointment there was only a minimal difference in blood pressure between the right and left upper extremities 3. Celiac artery stenosis: Despite the severity of the celiac artery stenosis, it would be unexpected to cause severe weight loss in the absence of concurrent superior mesenteric artery stenosis.  We will send the results of the last study to his gastroenterologist, Dr. Conley Rolls. 4. Underweight: Remains unexplained.  His wife raises the concern for celiac disease.  Jerry Roth reports that he thinks he had a small bowel biopsy that was negative. 5. Right thyroid nodule: Multiple thyroid assays have been normal.  We will make sure that his endocrinologist, Dr. Allena Katz, receives a copy of his CT of the chest that describes the right thyroid nodule, which appears to be a Roth finding.  I am not sure if Jerry Roth has ever undergone a nuclear radioactive iodine uptake study.  It is quite possible this is an unimportant incidental finding. 6. MVP/MR: The echo confirms that his systolic click is due to mitral valve prolapse but he only has mild to moderate mitral insufficiency but does not require any specific therapy.   7. HLP: 2017 his LDL was elevated at 107 (target less than 70) but he has lost substantial weight.  We will recheck when he comes in to get his precath labs. 8. Pulmonary nodule: Per the radiology recommendations we will plan a repeat CT of the chest to reevaluate the groundglass opacity and subcentimeter nodule in the lingula in about 6 months.   Medication Adjustments/Labs and Tests Ordered: Current medicines are reviewed at length with the patient today.  Concerns regarding medicines are outlined above.  Orders Placed This Encounter  Procedures  . Basic metabolic panel  . CBC  . Lipid panel  . EKG 12-Lead   No orders of the defined types were placed in this encounter.   Patient Instructions   [facility] CARDIOVASCULAR DIVISION [facility] [address] [place] [address] [place] [address] Dept: [phone] Loc: [phone]  Jerry Roth  04/18/2018  You are scheduled for a Cardiac Catheterization on Wednesday, November 6 with Dr. Peter [place].  1. Please  arrive at the [facility] (Main Entrance A) at [facility]: [address] [place], [place] [address] at 8:00 AM (This time is two hours before your procedure to ensure your preparation). Free valet parking service is available.   Special note: Every effort is made to have your procedure done on time. Please understand that emergencies sometimes delay scheduled procedures.  2. Diet: Do not eat solid foods after midnight.  The patient may have clear liquids until 5am upon the day of the procedure.  3. Labs: You will need to have lab work completed tomorrow - FASTING. We will be checking your cholesterol.  4. Medication instructions in preparation for your procedure:   Contrast Allergy: No  On the morning of your procedure, take an 81 mg Aspirin and any morning medicines NOT listed above.  You may use sips of water.  5. Plan for one night stay--bring personal belongings.  6. Bring a current list of your medications and current insurance cards.  7. You MUST have a responsible person to drive you home.  8. Someone MUST be with you the first 24 hours after you arrive home or your discharge will be delayed.  9. Please wear clothes that are easy to get on and off and wear slip-on shoes.  Thank you for allowing [place] to care for you!   -- [facility]    Signed, Thurmon Fair, MD  04/18/2018 7:29 PM    [facility]\

## 2018-04-18 NOTE — Progress Notes (Signed)
Cardiology Office Note:    Date:  04/18/2018   ID:  Jerry Roth, DOB 04/16/1936, MRN [id number]  PCP:  Piazza, Michael J, MD  Cardiologist:  No primary care provider on file.   Referring MD: Piazza, Michael J, MD   Chief Complaint  Patient presents with  . Coronary Artery Disease    History of Present Illness:    Jerry Roth is a 82 y.o. male with a hx of celiac artery stenosis who was evaluated for an episode of chest pain in June.    He underwent a coronary CT angiogram that showed the presence of a severe stenosis in the proximal large oblique marginal branch of the circumflex coronary artery.  Fractional flow reserve this appears to be a significant stenosis.  Fractional flow reserve also shows diminished flow in the distal LAD, likely has a clear relative result of multiple minor stenoses/diffuse disease.  His calcium score is elevated, placing him in the 60th percentile for his age and gender.  Incidental findings which were also described on his noncardiac CT from June include the area of ground glass opacities with some nodularity in the lingula, bilateral apical lung scarring and a 2 cm nodule in the right lobe of the thyroid.  As far as I can tell these are unchanged, nonprogressive findings.  Jerry Roth also underwent a duplex ultrasound of the abdominal aorta that confirms a severe stenosis of the celiac artery with a widely patent superior mesenteric artery and inferior mesenteric artery and no evidence of renal artery stenosis on either side.  He continues to slowly lose weight.  His BMI is now 18.0.  This is despite the fact that he reports eating a healthy rich diet.  Lipid profile from November 2017 shows a total cholesterol 197, triglycerides 53, HDL 77, LDL 106.  He is originally from [place] and is currently a resident at the [facility] retirement facility with his wife.  They may move to the [place] area next spring.  There is no family  history of coronary disease.  His father had a cardiac arrest after undergoing cholecystectomy.  His sister underwent surgery for 2 heart valves (1 repaired, 1 replaced) about a year ago.  Past Medical History:  Diagnosis Date  . Celiac artery stenosis (HCC)   . Multiple thyroid nodules    Benign on biopsy    Past Surgical History:  Procedure Laterality Date  . HERNIA REPAIR     x3  . HERNIA REPAIR     x 3  . KNEE SURGERY    . SHOULDER SURGERY    . SHOULDER SURGERY Right 2016  . TONSILLECTOMY AND ADENOIDECTOMY     82 yrs old  . TONSILLECTOMY AND ADENOIDECTOMY     Childhood    Current Medications: Current Meds  Medication Sig  . Cholecalciferol (VITAMIN D3 PO) Take 2,000 Units by mouth daily.      Allergies:   Shellfish allergy   Social History   Socioeconomic History  . Marital status: Married    Spouse name: Not on file  . Number of children: Not on file  . Years of education: Not on file  . Highest education level: Not on file  Occupational History  . Not on file  Social Needs  . Financial resource strain: Not on file  . Food insecurity:    Worry: Not on file    Inability: Not on file  . Transportation needs:    Medical: Not on file    Non-medical:   Not on file  Tobacco Use  . Smoking status: Never Smoker  . Smokeless tobacco: Never Used  Substance and Sexual Activity  . Alcohol use: Yes    Alcohol/week: 2.0 standard drinks    Types: 2 Glasses of wine per week  . Drug use: Never  . Sexual activity: Not on file  Lifestyle  . Physical activity:    Days per week: Not on file    Minutes per session: Not on file  . Stress: Not on file  Relationships  . Social connections:    Talks on phone: Not on file    Gets together: Not on file    Attends religious service: Not on file    Active member of club or organization: Not on file    Attends meetings of clubs or organizations: Not on file    Relationship status: Not on file  Other Topics Concern  . Not  on file  Social History Narrative  . Not on file     Family History: The patient's family history includes Cancer - Colon in his mother; Gallbladder disease in his father; Heart attack in his father; Hypertension in his father and mother.  ROS:   Please see the history of present illness.    All other systems are reviewed and are negative EKGs/Labs/Other Studies Reviewed:    The following studies were reviewed today: Notes from the emergency room visit June 2019, notes from vascular surgeon June 2018  EKG:  EKG is ordered today.  The ekg ordered today shows sinus bradycardia, normal tracing, QTC 490 ms, no repolarization abnormalities  Recent Labs: 11/18/2017: Hemoglobin 12.2; Platelets 129 03/01/2018: BUN 19; Creatinine, Ser 1.06; Potassium 4.4; Sodium 142  Recent Lipid Panel 2017: Total cholesterol 197, triglycerides 53, HDL 77, LDL 106  Physical Exam:    VS:  BP 108/60   Pulse (!) 54   Ht 6' (1.829 m)   Wt 133 lb (60.3 kg)   BMI 18.04 kg/m      Wt Readings from Last 3 Encounters:  04/18/18 133 lb (60.3 kg)  02/11/18 135 lb (61.2 kg)  11/18/17 133 lb (60.3 kg)     General: Alert, oriented x3, no distress, Underweight/well-nourished Head: no evidence of trauma, PERRL, EOMI, no exophtalmos or lid lag, no myxedema, no xanthelasma; normal ears, nose and oropharynx Neck: normal jugular venous pulsations and no hepatojugular reflux; brisk carotid pulses without delay and no carotid bruits Chest: clear to auscultation, no signs of consolidation by percussion or palpation, normal fremitus, symmetrical and full respiratory excursions Cardiovascular: normal position and quality of the apical impulse, regular rhythm, normal first and second heart sounds, apical mid-systolic click followed by a short late systolic murmur, both intensified with a Valsalva maneuver, no rubs or gallops Abdomen: no tenderness or distention, no masses by palpation, no abnormal pulsatility or arterial  bruits, normal bowel sounds, no hepatosplenomegaly Extremities: no clubbing, cyanosis or edema; 2+ radial, ulnar and brachial pulses bilaterally; 2+ right femoral, posterior tibial and dorsalis pedis pulses; 2+ left femoral, posterior tibial and dorsalis pedis pulses; no subclavian or femoral bruits Neurological: grossly nonfocal Psych: Normal mood and affect   ASSESSMENT:    1. Atherosclerosis of native coronary artery of native heart with other form of angina pectoris (HCC)   2. Abnormal findings on diagnostic imaging of heart and coronary circulation   3. Hypotension, unspecified hypotension type   4. Celiac artery stenosis (HCC)   5. Severely underweight adult   6. Right thyroid nodule     7. Non-rheumatic mitral regurgitation   8. Hypercholesterolemia   9. Pulmonary nodule, left   10. Mitral valve prolapse    PLAN:    In order of problems listed above:  1. CAD: His CT shows above average burden of coronary atherosclerosis and evidence of a severe stenosis in a large oblique marginal artery.  He had an episode of angina at rest of few weeks ago.  In addition he has evidence of apical ischemia, probably due to diffuse disease in the LAD artery.  He has preserved left ventricular systolic function.  I have recommended that he undergo coronary angiography and possible placement of a stent in the OM artery.  We discussed the pros and cons of this procedure, alternative therapies, possible complications, the need for long-term dual antiplatelet therapy if he receives a stent (6 months).  This procedure has been fully reviewed with the patient and written informed consent has been obtained. His heart rate does not allow treatment with beta-blockers.  She start taking aspirin 81 mg once daily.  We will recheck his lipid profile.  Target LDL cholesterol less than 70.  He should be reevaluated since he has lost so much weight. 2. Hypotension: Compared to his last appointment, his blood pressure is  now normal.  There is no evidence of subclavian stenosis by physical exam, his last appointment there was only a minimal difference in blood pressure between the right and left upper extremities 3. Celiac artery stenosis: Despite the severity of the celiac artery stenosis, it would be unexpected to cause severe weight loss in the absence of concurrent superior mesenteric artery stenosis.  We will send the results of the last study to his gastroenterologist, Dr. Le. 4. Underweight: Remains unexplained.  His wife raises the concern for celiac disease.  Jerry Roth reports that he thinks he had a small bowel biopsy that was negative. 5. Right thyroid nodule: Multiple thyroid assays have been normal.  We will make sure that his endocrinologist, Dr. Patel, receives a copy of his CT of the chest that describes the right thyroid nodule, which appears to be a stable finding.  I am not sure if Jerry Roth has ever undergone a nuclear radioactive iodine uptake study.  It is quite possible this is an unimportant incidental finding. 6. MVP/MR: The echo confirms that his systolic click is due to mitral valve prolapse but he only has mild to moderate mitral insufficiency but does not require any specific therapy.   7. HLP: 2017 his LDL was elevated at 107 (target less than 70) but he has lost substantial weight.  We will recheck when he comes in to get his precath labs. 8. Pulmonary nodule: Per the radiology recommendations we will plan a repeat CT of the chest to reevaluate the groundglass opacity and subcentimeter nodule in the lingula in about 6 months.   Medication Adjustments/Labs and Tests Ordered: Current medicines are reviewed at length with the patient today.  Concerns regarding medicines are outlined above.  Orders Placed This Encounter  Procedures  . Basic metabolic panel  . CBC  . Lipid panel  . EKG 12-Lead   No orders of the defined types were placed in this encounter.   Patient Instructions   [facility] CARDIOVASCULAR DIVISION [facility] [address] [place] [address] Dept: [phone] Loc: [phone]  Jerry Roth  04/18/2018  You are scheduled for a Cardiac Catheterization on Wednesday, November 6 with Dr. Peter Jordan.  1. Please   arrive at the [place] [facility] (Main Entrance A) at [facility]: [address] [place], [place] [address] at 8:00 AM (This time is two hours before your procedure to ensure your preparation). Free valet parking service is available.   Special note: Every effort is made to have your procedure done on time. Please understand that emergencies sometimes delay scheduled procedures.  2. Diet: Do not eat solid foods after midnight.  The patient may have clear liquids until 5am upon the day of the procedure.  3. Labs: You will need to have lab work completed tomorrow - FASTING. We will be checking your cholesterol.  4. Medication instructions in preparation for your procedure:   Contrast Allergy: No  On the morning of your procedure, take an 81 mg Aspirin and any morning medicines NOT listed above.  You may use sips of water.  5. Plan for one night stay--bring personal belongings.  6. Bring a current list of your medications and current insurance cards.  7. You MUST have a responsible person to drive you home.  8. Someone MUST be with you the first 24 hours after you arrive home or your discharge will be delayed.  9. Please wear clothes that are easy to get on and off and wear slip-on shoes.  Thank you for allowing us to care for you!   -- [facility]    Signed, Tabari Volkert, MD  04/18/2018 7:29 PM    [facility]\ 

## 2018-04-18 NOTE — Patient Instructions (Signed)
  [facility] CARDIOVASCULAR DIVISION [facility] NORTHLINE [address] [place] [address] [place] [address] Dept: [phone] Loc: [phone]  Raziel Koenigs  04/18/2018  You are scheduled for a Cardiac Catheterization on Wednesday, November 6 with Dr. Peter Swaziland.  1. Please arrive at the [facility] (Main Entrance A) at [facility]: [address] [place], [place] [address] at 8:00 AM (This time is two hours before your procedure to ensure your preparation). Free valet parking service is available.   Special note: Every effort is made to have your procedure done on time. Please understand that emergencies sometimes delay scheduled procedures.  2. Diet: Do not eat solid foods after midnight.  The patient may have clear liquids until 5am upon the day of the procedure.  3. Labs: You will need to have lab work completed tomorrow - FASTING. We will be checking your cholesterol.  4. Medication instructions in preparation for your procedure:   Contrast Allergy: No  On the morning of your procedure, take an 81 mg Aspirin and any morning medicines NOT listed above.  You may use sips of water.  5. Plan for one night stay--bring personal belongings.  6. Bring a current list of your medications and current insurance cards.  7. You MUST have a responsible person to drive you home.  8. Someone MUST be with you the first 24 hours after you arrive home or your discharge will be delayed.  9. Please wear clothes that are easy to get on and off and wear slip-on shoes.  Thank you for allowing [place] to care for you!   -- [facility]

## 2018-04-19 ENCOUNTER — Telehealth: Payer: Self-pay | Admitting: *Deleted

## 2018-04-19 NOTE — Addendum Note (Signed)
Addended by: Neta Ehlers on: 04/19/2018 08:15 AM   Modules accepted: Orders

## 2018-04-19 NOTE — Telephone Encounter (Signed)
Pt contacted pre-catheterization scheduled at [facility] for: Wednesday April 20, 2018 10 AM Verified arrival time and place: [facility] Main Entrance A at: 8 AM  No solid food after midnight prior to cath, clear liquids until 5 AM day of procedure. Contrast allergy: no-tolerated coronary CT contrast. Verified no diabetes medications: no  AM meds can be  taken pre-cath with sip of water including: ASA 81 mg  Confirmed patient has responsible person to drive home post procedure and for 24 hours after you arrive home: yes

## 2018-04-20 ENCOUNTER — Other Ambulatory Visit: Payer: Self-pay

## 2018-04-20 ENCOUNTER — Encounter (HOSPITAL_COMMUNITY)
Admission: RE | Disposition: A | Discharge status: Home / Self Care | Payer: Self-pay | Source: Ambulatory Visit | Attending: Cardiology

## 2018-04-20 ENCOUNTER — Ambulatory Visit (HOSPITAL_COMMUNITY)
Admission: RE | Admit: 2018-04-20 | Discharge: 2018-04-20 | Disposition: A | Discharge status: Home / Self Care | Payer: Medicare Other | Source: Ambulatory Visit | Attending: Cardiology | Admitting: Cardiology

## 2018-04-20 DIAGNOSIS — E7800 Pure hypercholesterolemia, unspecified: Secondary | ICD-10-CM | POA: Diagnosis present

## 2018-04-20 DIAGNOSIS — I340 Nonrheumatic mitral (valve) insufficiency: Secondary | ICD-10-CM | POA: Insufficient documentation

## 2018-04-20 DIAGNOSIS — I25119 Atherosclerotic heart disease of native coronary artery with unspecified angina pectoris: Secondary | ICD-10-CM | POA: Diagnosis not present

## 2018-04-20 DIAGNOSIS — R636 Underweight: Secondary | ICD-10-CM | POA: Insufficient documentation

## 2018-04-20 DIAGNOSIS — Z8249 Family history of ischemic heart disease and other diseases of the circulatory system: Secondary | ICD-10-CM | POA: Insufficient documentation

## 2018-04-20 DIAGNOSIS — I2584 Coronary atherosclerosis due to calcified coronary lesion: Secondary | ICD-10-CM | POA: Insufficient documentation

## 2018-04-20 DIAGNOSIS — I774 Celiac artery compression syndrome: Secondary | ICD-10-CM | POA: Diagnosis not present

## 2018-04-20 DIAGNOSIS — R072 Precordial pain: Secondary | ICD-10-CM | POA: Diagnosis present

## 2018-04-20 DIAGNOSIS — I341 Nonrheumatic mitral (valve) prolapse: Secondary | ICD-10-CM | POA: Insufficient documentation

## 2018-04-20 DIAGNOSIS — I959 Hypotension, unspecified: Secondary | ICD-10-CM | POA: Diagnosis not present

## 2018-04-20 DIAGNOSIS — R931 Abnormal findings on diagnostic imaging of heart and coronary circulation: Secondary | ICD-10-CM

## 2018-04-20 DIAGNOSIS — Z79899 Other long term (current) drug therapy: Secondary | ICD-10-CM | POA: Diagnosis not present

## 2018-04-20 DIAGNOSIS — E041 Nontoxic single thyroid nodule: Secondary | ICD-10-CM | POA: Insufficient documentation

## 2018-04-20 DIAGNOSIS — I2510 Atherosclerotic heart disease of native coronary artery without angina pectoris: Secondary | ICD-10-CM

## 2018-04-20 HISTORY — PX: LEFT HEART CATH AND CORONARY ANGIOGRAPHY: CATH118249

## 2018-04-20 LAB — BASIC METABOLIC PANEL
BUN/Creatinine Ratio: 15 (ref 10–24)
BUN: 15 mg/dL (ref 8–27)
CHLORIDE: 103 mmol/L (ref 96–106)
CO2: 25 mmol/L (ref 20–29)
Calcium: 9.1 mg/dL (ref 8.6–10.2)
Creatinine, Ser: 1 mg/dL (ref 0.76–1.27)
GFR calc non Af Amer: 71 mL/min/{1.73_m2} (ref >59)
GFR, EST AFRICAN AMERICAN: 82 mL/min/{1.73_m2} (ref >59)
Glucose: 90 mg/dL (ref 65–99)
Potassium: 4.3 mmol/L (ref 3.5–5.2)
Sodium: 140 mmol/L (ref 134–144)

## 2018-04-20 LAB — CBC
HEMATOCRIT: 39.6 % (ref 37.5–51.0)
HEMOGLOBIN: 13 g/dL (ref 13.0–17.7)
MCH: 31.4 pg (ref 26.6–33.0)
MCHC: 32.8 g/dL (ref 31.5–35.7)
MCV: 96 fL (ref 79–97)
PLATELETS: 154 10*3/uL (ref 150–450)
RBC: 4.14 x10E6/uL (ref 4.14–5.80)
RDW: 12 % — ABNORMAL LOW (ref 12.3–15.4)
WBC: 3.2 10*3/uL — AB (ref 3.4–10.8)

## 2018-04-20 LAB — LIPID PANEL
Chol/HDL Ratio: 2.3 ratio (ref 0.0–5.0)
Cholesterol, Total: 181 mg/dL (ref 100–199)
HDL: 79 mg/dL (ref >39)
LDL Calculated: 95 mg/dL (ref 0–99)
Triglycerides: 36 mg/dL (ref 0–149)
VLDL CHOLESTEROL CAL: 7 mg/dL (ref 5–40)

## 2018-04-20 SURGERY — LEFT HEART CATH AND CORONARY ANGIOGRAPHY
Anesthesia: LOCAL

## 2018-04-20 MED ORDER — SODIUM CHLORIDE 0.9% FLUSH: 3.0000 mL | Freq: Two times a day (BID) | INTRAVENOUS | Status: DC

## 2018-04-20 MED ORDER — HEPARIN (PORCINE) IN NACL 2000-0.9 UNIT/L-% IV SOLN: INTRAVENOUS | Status: DC | PRN

## 2018-04-20 MED ORDER — HEPARIN (PORCINE) IN NACL 1000-0.9 UT/500ML-% IV SOLN
INTRAVENOUS | Status: AC
  Filled 2018-04-20: qty 500

## 2018-04-20 MED ORDER — LIDOCAINE HCL (PF) 1 % IJ SOLN
INTRAMUSCULAR | Status: DC | PRN
  Administered 2018-04-20: 2 mL

## 2018-04-20 MED ORDER — MIDAZOLAM HCL 2 MG/2ML IJ SOLN
INTRAMUSCULAR | Status: DC | PRN
  Administered 2018-04-20: 1 mg via INTRAVENOUS

## 2018-04-20 MED ORDER — SODIUM CHLORIDE 0.9 % IV SOLN: 250.0000 mL | INTRAVENOUS | Status: DC | PRN

## 2018-04-20 MED ORDER — FENTANYL CITRATE (PF) 100 MCG/2ML IJ SOLN
INTRAMUSCULAR | Status: DC | PRN
  Administered 2018-04-20: 25 ug via INTRAVENOUS

## 2018-04-20 MED ORDER — HEPARIN SODIUM (PORCINE) 1000 UNIT/ML IJ SOLN
INTRAMUSCULAR | Status: DC | PRN
  Administered 2018-04-20: 3000 [IU] via INTRAVENOUS

## 2018-04-20 MED ORDER — FENTANYL CITRATE (PF) 100 MCG/2ML IJ SOLN
INTRAMUSCULAR | Status: AC
  Filled 2018-04-20: qty 2

## 2018-04-20 MED ORDER — ONDANSETRON HCL 4 MG/2ML IJ SOLN: 4.0000 mg | Freq: Four times a day (QID) | INTRAMUSCULAR | Status: DC | PRN

## 2018-04-20 MED ORDER — LIDOCAINE HCL (PF) 1 % IJ SOLN
INTRAMUSCULAR | Status: AC
  Filled 2018-04-20: qty 30

## 2018-04-20 MED ORDER — MIDAZOLAM HCL 2 MG/2ML IJ SOLN
INTRAMUSCULAR | Status: AC
  Filled 2018-04-20: qty 2

## 2018-04-20 MED ORDER — VERAPAMIL HCL 2.5 MG/ML IV SOLN
INTRAVENOUS | Status: AC
  Filled 2018-04-20: qty 2

## 2018-04-20 MED ORDER — HEPARIN (PORCINE) IN NACL 1000-0.9 UT/500ML-% IV SOLN: INTRAVENOUS | Status: DC | PRN

## 2018-04-20 MED ORDER — SODIUM CHLORIDE 0.9 % WEIGHT BASED INFUSION: 1.0000 mL/kg/h | INTRAVENOUS | Status: AC

## 2018-04-20 MED ORDER — IOHEXOL 350 MG/ML SOLN
INTRAVENOUS | Status: DC | PRN
  Administered 2018-04-20: 90 mL via INTRA_ARTERIAL

## 2018-04-20 MED ORDER — HEPARIN (PORCINE) IN NACL 1000-0.9 UT/500ML-% IV SOLN
INTRAVENOUS | Status: DC | PRN
  Administered 2018-04-20 (×2): 500 mL

## 2018-04-20 MED ORDER — ASPIRIN 81 MG PO CHEW: 81.0000 mg | CHEWABLE_TABLET | ORAL | Status: DC

## 2018-04-20 MED ORDER — ACETAMINOPHEN 325 MG PO TABS: 650.0000 mg | ORAL_TABLET | ORAL | Status: DC | PRN

## 2018-04-20 MED ORDER — SODIUM CHLORIDE 0.9% FLUSH: 3.0000 mL | INTRAVENOUS | Status: DC | PRN

## 2018-04-20 MED ORDER — VERAPAMIL HCL 2.5 MG/ML IV SOLN
INTRAVENOUS | Status: DC | PRN
  Administered 2018-04-20: 11:00:00 via INTRA_ARTERIAL

## 2018-04-20 MED ORDER — SODIUM CHLORIDE 0.9 % IV SOLN
INTRAVENOUS | Status: DC
  Administered 2018-04-20: 09:00:00 via INTRAVENOUS

## 2018-04-20 SURGICAL SUPPLY — 12 items
CATH 5FR JL3.5 JR4 ANG PIG MP (CATHETERS) ×2 IMPLANT
DEVICE RAD COMP TR BAND LRG (VASCULAR PRODUCTS) ×2 IMPLANT
GLIDESHEATH SLEND SS 6F .021 (SHEATH) ×2 IMPLANT
GUIDEWIRE ANGLED .035X150CM (WIRE) ×2 IMPLANT
GUIDEWIRE INQWIRE 1.5J.035X260 (WIRE) ×2 IMPLANT
INQWIRE 1.5J .035X260CM (WIRE) ×4
KIT HEART LEFT (KITS) ×2 IMPLANT
PACK CARDIAC CATHETERIZATION (CUSTOM PROCEDURE TRAY) ×2 IMPLANT
SYR MEDRAD MARK V 150ML (SYRINGE) ×2 IMPLANT
TRANSDUCER W/STOPCOCK (MISCELLANEOUS) ×2 IMPLANT
TUBING CIL FLEX 10 FLL-RA (TUBING) ×2 IMPLANT
WIRE HI TORQ VERSACORE-J 145CM (WIRE) ×2 IMPLANT

## 2018-04-20 NOTE — Progress Notes (Signed)
Manual pressure held to radial artery under TR band for 15 minutes. Site continues to ooze. Air added to band. Pt now c/o "wooziness" and BP is 62/41. PA notified.

## 2018-04-20 NOTE — Interval H&P Note (Signed)
History and Physical Interval Note:  04/20/2018 10:07 AM  [name]  has presented today for surgery, with the diagnosis of abnormal ct  The various methods of treatment have been discussed with the patient and family. After consideration of risks, benefits and other options for treatment, the patient has consented to  Procedure(s): LEFT HEART CATH AND CORONARY ANGIOGRAPHY (N/A) as a surgical intervention .  The patient's history has been reviewed, patient examined, no change in status, stable for surgery.  I have reviewed the patient's chart and labs.  Questions were answered to the patient's satisfaction.    Cath Lab Visit (complete for each Cath Lab visit)  Clinical Evaluation Leading to the Procedure:   ACS: No.  Non-ACS:    Anginal Classification: CCS I  Anti-ischemic medical therapy: No Therapy  Non-Invasive Test Results: Intermediate-risk stress test findings: cardiac mortality 1-3%/year  Prior CABG: No previous CABG       Theron Arista [facility] 04/20/2018 10:07 AM

## 2018-04-20 NOTE — Progress Notes (Signed)
Shellfish allergy noted Jerry Roth called and informed. States no ned to pre med for this

## 2018-04-20 NOTE — Progress Notes (Signed)
Thank you MCr 

## 2018-04-20 NOTE — Discharge Instructions (Signed)
Drink plenty of fluids over next 48 hours and keep right wrist elevated at heart level for 24 hours ° °Radial Site Care °Refer to this sheet in the next few weeks. These instructions provide you with information about caring for yourself after your procedure. Your health care provider may also give you more specific instructions. Your treatment has been planned according to current medical practices, but problems sometimes occur. Call your health care provider if you have any problems or questions after your procedure. °What can I expect after the procedure? °After your procedure, it is typical to have the following: °· Bruising at the radial site that usually fades within 1-2 weeks. °· Blood collecting in the tissue (hematoma) that may be painful to the touch. It should usually decrease in size and tenderness within 1-2 weeks. ° °Follow these instructions at home: °· Take medicines only as directed by your health care provider. °· You may shower 24-48 hours after the procedure or as directed by your health care provider. Remove the bandage (dressing) and gently wash the site with plain soap and water. Pat the area dry with a clean towel. Do not rub the site, because this may cause bleeding. °· Do not take baths, swim, or use a hot tub until your health care provider approves. °· Check your insertion site every day for redness, swelling, or drainage. °· Do not apply powder or lotion to the site. °· Do not flex or bend the affected arm for 24 hours or as directed by your health care provider. °· Do not push or pull heavy objects with the affected arm for 24 hours or as directed by your health care provider. °· Do not lift over 10 lb (4.5 kg) for 5 days after your procedure or as directed by your health care provider. °· Ask your health care provider when it is okay to: °? Return to work or school. °? Resume usual physical activities or sports. °? Resume sexual activity. °· Do not drive home if you are discharged the  same day as the procedure. Have someone else drive you. °· You may drive 24 hours after the procedure unless otherwise instructed by your health care provider. °· Do not operate machinery or power tools for 24 hours after the procedure. °· If your procedure was done as an outpatient procedure, which means that you went home the same day as your procedure, a responsible adult should be with you for the first 24 hours after you arrive home. °· Keep all follow-up visits as directed by your health care provider. This is important. °Contact a health care provider if: °· You have a fever. °· You have chills. °· You have increased bleeding from the radial site. Hold pressure on the site. °Get help right away if: °· You have unusual pain at the radial site. °· You have redness, warmth, or swelling at the radial site. °· You have drainage (other than a small amount of blood on the dressing) from the radial site. °· The radial site is bleeding, and the bleeding does not stop after 30 minutes of holding steady pressure on the site. °· Your arm or hand becomes pale, cool, tingly, or numb. °This information is not intended to replace advice given to you by your health care provider. Make sure you discuss any questions you have with your health care provider. °Document Released: 07/04/2010 Document Revised: 11/07/2015 Document Reviewed: 12/18/2013 °Elsevier Interactive Patient Education © 2018 [employer] Inc[employer] ° °

## 2018-04-21 ENCOUNTER — Encounter (HOSPITAL_COMMUNITY): Payer: Self-pay | Admitting: Cardiology

## 2018-05-03 ENCOUNTER — Ambulatory Visit (INDEPENDENT_AMBULATORY_CARE_PROVIDER_SITE_OTHER): Payer: Medicare Other | Admitting: Physician Assistant

## 2018-05-03 ENCOUNTER — Encounter: Payer: Self-pay | Admitting: Physician Assistant

## 2018-05-03 VITALS — BP 108/65 | HR 59 | Ht 72.0 in | Wt 134.2 lb

## 2018-05-03 DIAGNOSIS — E7800 Pure hypercholesterolemia, unspecified: Secondary | ICD-10-CM | POA: Diagnosis not present

## 2018-05-03 DIAGNOSIS — I2510 Atherosclerotic heart disease of native coronary artery without angina pectoris: Secondary | ICD-10-CM | POA: Diagnosis not present

## 2018-05-03 DIAGNOSIS — I771 Stricture of artery: Secondary | ICD-10-CM

## 2018-05-03 DIAGNOSIS — I774 Celiac artery compression syndrome: Secondary | ICD-10-CM

## 2018-05-03 MED ORDER — ATORVASTATIN CALCIUM 20 MG PO TABS: 20.0000 mg | ORAL_TABLET | Freq: Every day | ORAL | 3 refills | Status: DC

## 2018-05-03 MED ORDER — NITROGLYCERIN 0.4 MG SL SUBL: 0.4000 mg | SUBLINGUAL_TABLET | SUBLINGUAL | 3 refills | Status: AC | PRN

## 2018-05-03 MED ORDER — ASPIRIN EC 81 MG PO TBEC: 81.0000 mg | DELAYED_RELEASE_TABLET | Freq: Every day | ORAL | 3 refills | Status: AC

## 2018-05-03 NOTE — Patient Instructions (Signed)
Medication Instructions:  START Baby Aspirin 81 mg Take 1 tablet once day START Lipitor 20 gm Take 1 tablet once day  START Nitro Take as needed for emergency chest pain If you need a refill on your cardiac medications before your next appointment, please call your pharmacy.   Lab work: Your physician recommends that you return for lab work in: 6-8 weeks FASTING-LIPID, LFT If you have labs (blood work) drawn today and your tests are completely normal, you will receive your results only by: Marland Kitchen. MyChart Message (if you have MyChart) OR . A paper copy in the mail If you have any lab test that is abnormal or we need to change your treatment, we will call you to review the results.  Testing/Procedures: None   Follow-Up: At [facility], you and your health needs are our priority.  As part of our continuing mission to provide you with exceptional heart care, we have created designated Provider Care Teams.  These Care Teams include your primary Cardiologist (physician) and Advanced Practice Providers (APPs -  Physician Assistants and Nurse Practitioners) who all work together to provide you with the care you need, when you need it. Your physician recommends that you schedule a follow-up appointment in: 3 months with Dr Royann Shiversroitoru.  Any Other Special Instructions Will Be Listed Below (If Applicable).

## 2018-05-03 NOTE — Progress Notes (Signed)
Cardiology Office Note    Date:  05/03/2018   ID:  Jerry Roth, DOB 1936/03/03, MRN [id number]  PCP:  Nadara Eaton, MD  Cardiologist: Dr. Royann Shivers  Chief Complaint  Patient presents with  . Follow-up    seen for Dr. Royann Shivers.     History of Present Illness:  Jerry Roth is a 83 y.o. male with past medical history of celiac artery disease and thyroid nodule who underwent coronary CT for evaluation of chest pain which revealed severe stenosis in the proximal large oblique marginal branch of left circumflex artery.  Fractional flow reserve demonstrated severe stenosis in the OM and also diminished flow in the distal LAD.  Previous noncardiac CT in June also demonstrated an area of groundglass opacity with some nodularities in the lingula, bilateral apical lung scarring and a 2 cm nodule in the right lobe of the thyroid.  Duplex ultrasound of the abdominal aorta confirmed severe stenosis in the celiac artery with widely patent superior mesenteric artery and inferior mesenteric artery ended no evidence of renal artery stenosis.  He was seen by Dr. Royann Shivers on 04/18/2018 with and was set up for cardiac catheterization.  He eventually underwent cardiac catheterization on 04/20/2018 which demonstrated 50% mid LAD lesion, 50% proximal to mid left circumflex lesion, 85% ostial OM1 lesion, 70% lateral OM1 lesion, EF 55 to 65%.  Medical therapy was recommended as patient had essentially single-vessel disease with normal LV function and very little anginal symptom.  PCI therapy of OM would be very difficult due to tortuosity of the blood vessel.  Patient presents today to cardiology office visit.  He is currently living [place] [facility] with his wife and thinking about moving back to [place].  Given the recent cardiac catheterization fine, I recommend at 81 mg daily aspirin.  His last LDL is 95, his LDL goal is less than 70, I will start him on 20 mg daily of Lipitor.  He will  need a 6 to 8 weeks fasting lipid panel and LFT.  He has bradycardia and borderline blood pressure does not allow me to add beta-blocker, calcium channel blocker or long-acting nitrate.  However patient does not have any exertional chest pain and that he swim 3 times on weekly basis without any issue.  His radial cath site is quite stable with good pulse.  He still have a little soreness in the area, however I do not see any obvious bleeding.  The soreness likely will resolve with time.    Past Medical History:  Diagnosis Date  . Celiac artery stenosis (HCC)   . Multiple thyroid nodules    Benign on biopsy    Past Surgical History:  Procedure Laterality Date  . HERNIA REPAIR     x3  . HERNIA REPAIR     x 3  . KNEE SURGERY    . LEFT HEART CATH AND CORONARY ANGIOGRAPHY N/A 04/20/2018   Procedure: LEFT HEART CATH AND CORONARY ANGIOGRAPHY;  Surgeon: Swaziland, Peter M, MD;  Location: [facility] INVASIVE CV LAB;  Service: Cardiovascular;  Laterality: N/A;  . SHOULDER SURGERY    . SHOULDER SURGERY Right 2016  . TONSILLECTOMY AND ADENOIDECTOMY     82 yrs old  . TONSILLECTOMY AND ADENOIDECTOMY     Childhood    Current Medications: Outpatient Medications Prior to Visit  Medication Sig Dispense Refill  . Carboxymethylcellulose Sod PF (REFRESH CELLUVISC) 1 % GEL Place 1 drop into both eyes daily.    . Carboxymethylcellulose Sod PF (REFRESH  PLUS) 0.5 % SOLN Place 1 drop into both eyes 4 (four) times daily.    . Cholecalciferol (VITAMIN D3) 2000 units capsule Take 2,000 Units by mouth daily.      No facility-administered medications prior to visit.      Allergies:   Shellfish allergy   Social History   Socioeconomic History  . Marital status: Married    Spouse name: Not on file  . Number of children: Not on file  . Years of education: Not on file  . Highest education level: Not on file  Occupational History  . Not on file  Social Needs  . Financial resource strain: Not on file  . Food  insecurity:    Worry: Not on file    Inability: Not on file  . Transportation needs:    Medical: Not on file    Non-medical: Not on file  Tobacco Use  . Smoking status: Never Smoker  . Smokeless tobacco: Never Used  Substance and Sexual Activity  . Alcohol use: Yes    Alcohol/week: 2.0 standard drinks    Types: 2 Glasses of wine per week  . Drug use: Never  . Sexual activity: Not on file  Lifestyle  . Physical activity:    Days per week: Not on file    Minutes per session: Not on file  . Stress: Not on file  Relationships  . Social connections:    Talks on phone: Not on file    Gets together: Not on file    Attends religious service: Not on file    Active member of club or organization: Not on file    Attends meetings of clubs or organizations: Not on file    Relationship status: Not on file  Other Topics Concern  . Not on file  Social History Narrative  . Not on file     Family History:  The patient's family history includes Cancer - Colon in his mother; Gallbladder disease in his father; Heart attack in his father; Hypertension in his father and mother.   ROS:   Please see the history of present illness.    ROS All other systems reviewed and are negative.   PHYSICAL EXAM:   VS:  BP 108/65   Pulse (!) 59   Ht 6' (1.829 m)   Wt 134 lb 3.2 oz (60.9 kg)   BMI 18.20 kg/m    GEN: Well nourished, well developed, in no acute distress  HEENT: normal  Neck: no JVD, carotid bruits, or masses Cardiac: RRR; no murmurs, rubs, or gallops,no edema  Respiratory:  clear to auscultation bilaterally, normal work of breathing GI: soft, nontender, nondistended, + BS MS: no deformity or atrophy  Skin: warm and dry, no rash Neuro:  Alert and Oriented x 3, Strength and sensation are intact Psych: euthymic mood, full affect  Wt Readings from Last 3 Encounters:  05/03/18 134 lb 3.2 oz (60.9 kg)  04/20/18 129 lb (58.5 kg)  04/18/18 133 lb (60.3 kg)      Studies/Labs Reviewed:     EKG:  EKG is not ordered today.    Recent Labs: 04/19/2018: BUN 15; Creatinine, Ser 1.00; Hemoglobin 13.0; Platelets 154; Potassium 4.3; Sodium 140   Lipid Panel    Component Value Date/Time   CHOL 181 04/19/2018 0809   TRIG 36 04/19/2018 0809   HDL 79 04/19/2018 0809   CHOLHDL 2.3 04/19/2018 0809   LDLCALC 95 04/19/2018 0809    Additional studies/ records that were reviewed  today include:   Cath 04/20/2018  Mid LAD lesion is 50% stenosed.  Prox Cx to Mid Cx lesion is 70% stenosed.  Ost 1st Mrg lesion is 85% stenosed.  Lat 1st Mrg lesion is 70% stenosed.  The left ventricular systolic function is normal.  LV end diastolic pressure is normal.  The left ventricular ejection fraction is 55-65% by visual estimate.   1. Single vessel obstructive CAD involving a large first OM. This correlates well with CTA/FFR results.  2. Normal LV function 3. Normal LVEDP  Plan: While the OM lesion may be hemodynamically significant I would recommend medical therapy at this point. He is asymptomatic and has single vessel disease with normal LV function. The OM lesion was very difficult to visualize due to vessel tortuosity and overlap despite multiple angulated views. This would make PCI very challenging.   Recommend Aspirin 81mg  daily for moderate CAD.    ASSESSMENT:    1. Coronary artery disease involving native coronary artery of native heart without angina pectoris   2. Hypercholesterolemia   3. Celiac artery stenosis (HCC)      PLAN:  In order of problems listed above:  1. CAD: Significant disease in the left circumflex artery, however due to the tortuosity of the blood vessel, PCI is not ideal.  Medical therapy was recommended.  I will start the patient on baby aspirin 81 mg daily and 20 mg daily of Lipitor.  I have given the patient sublingual nitroglycerin to take as needed.  Patient denies any recent exertional chest discomfort or shortness of breath.  I did not start  the patient on beta-blocker, amlodipine or long-acting nitrate because his blood pressure is borderline and his heart rate is unlikely to tolerate beta-blocker.  2. Hyperlipidemia: LDL goal less than 70, current LDL 2 weeks ago was 95.  Will start on Lipitor 20 mg daily.  Will need fasting lipid panel and LFT in 6 to 8 weeks.  3. Celiac artery stenosis: Largely asymptomatic from this.    Medication Adjustments/Labs and Tests Ordered: Current medicines are reviewed at length with the patient today.  Concerns regarding medicines are outlined above.  Medication changes, Labs and Tests ordered today are listed in the Patient Instructions below. Patient Instructions  Medication Instructions:  START Baby Aspirin 81 mg Take 1 tablet once day START Lipitor 20 gm Take 1 tablet once day  START Nitro Take as needed for emergency chest pain If you need a refill on your cardiac medications before your next appointment, please call your pharmacy.   Lab work: Your physician recommends that you return for lab work in: 6-8 weeks FASTING-LIPID, LFT If you have labs (blood work) drawn today and your tests are completely normal, you will receive your results only by: Marland Kitchen MyChart Message (if you have MyChart) OR . A paper copy in the mail If you have any lab test that is abnormal or we need to change your treatment, we will call you to review the results.  Testing/Procedures: None   Follow-Up: At [facility], you and your health needs are our priority.  As part of our continuing mission to provide you with exceptional heart care, we have created designated Provider Care Teams.  These Care Teams include your primary Cardiologist (physician) and Advanced Practice Providers (APPs -  Physician Assistants and Nurse Practitioners) who all work together to provide you with the care you need, when you need it. Your physician recommends that you schedule a follow-up appointment in: 3 months  with Dr Royann Shivers.  Any  Other Special Instructions Will Be Listed Below (If Applicable).      Ramond Dial, [place]  05/03/2018 10:37 AM    [facility] [address] [place], [place], [place]  [address] Phone: [phone]; Fax: [phone]

## 2018-05-09 NOTE — Progress Notes (Signed)
Agree with ASA and statin, no need for antianginals as scheduled medication. MCr

## 2018-06-20 ENCOUNTER — Emergency Department (HOSPITAL_BASED_OUTPATIENT_CLINIC_OR_DEPARTMENT_OTHER)
Admission: EM | Admit: 2018-06-20 | Discharge: 2018-06-20 | Disposition: A | Discharge status: Home / Self Care | Payer: Medicare Other | Attending: Emergency Medicine | Admitting: Emergency Medicine

## 2018-06-20 ENCOUNTER — Encounter (HOSPITAL_BASED_OUTPATIENT_CLINIC_OR_DEPARTMENT_OTHER): Payer: Self-pay

## 2018-06-20 DIAGNOSIS — R300 Dysuria: Secondary | ICD-10-CM | POA: Diagnosis present

## 2018-06-20 DIAGNOSIS — N3001 Acute cystitis with hematuria: Secondary | ICD-10-CM | POA: Diagnosis not present

## 2018-06-20 DIAGNOSIS — I2510 Atherosclerotic heart disease of native coronary artery without angina pectoris: Secondary | ICD-10-CM | POA: Insufficient documentation

## 2018-06-20 DIAGNOSIS — Z7982 Long term (current) use of aspirin: Secondary | ICD-10-CM | POA: Diagnosis not present

## 2018-06-20 DIAGNOSIS — Z79899 Other long term (current) drug therapy: Secondary | ICD-10-CM | POA: Insufficient documentation

## 2018-06-20 LAB — URINALYSIS, ROUTINE W REFLEX MICROSCOPIC
Bilirubin Urine: NEGATIVE
GLUCOSE, UA: NEGATIVE mg/dL
Ketones, ur: NEGATIVE mg/dL
Nitrite: POSITIVE — AB
Protein, ur: NEGATIVE mg/dL
Specific Gravity, Urine: 1.025 (ref 1.005–1.030)
pH: 6 (ref 5.0–8.0)

## 2018-06-20 LAB — URINALYSIS, MICROSCOPIC (REFLEX)

## 2018-06-20 MED ORDER — CEFTRIAXONE SODIUM 1 G IJ SOLR
1.0000 g | Freq: Once | INTRAMUSCULAR | Status: AC
  Administered 2018-06-20: 1 g via INTRAMUSCULAR
  Filled 2018-06-20: qty 10

## 2018-06-20 MED ORDER — CEPHALEXIN 500 MG PO CAPS: 500.0000 mg | ORAL_CAPSULE | Freq: Four times a day (QID) | ORAL | 0 refills | Status: DC

## 2018-06-20 NOTE — ED Triage Notes (Signed)
C/o dysuria, lower back pain, chills x 1 month-pt states he self caths and voids "very little" without cath- NAD-steady gait

## 2018-06-20 NOTE — ED Provider Notes (Signed)
[facility] EMERGENCY DEPARTMENT Provider Note   CSN: [id number] Arrival date & time: 06/20/18  1335     History   Chief Complaint Chief Complaint  Patient presents with  . Dysuria    HPI Jerry Roth is a 83 y.o. male.  Pt presents to the ED today with a possible UTI.  He has had burning with urination and feels like he has to urinate frequently.  He does self-cath and feels like he has to urinate as soon as he caths himself.  Pt has felt hot and has had some chills, but no documented fever.  He has been eating and drinking his normal amount.  He does have a urologist and called them to be seen today.  They told him to come here for eval.     Past Medical History:  Diagnosis Date  . Celiac artery stenosis (HCC)   . Multiple thyroid nodules    Benign on biopsy    Patient Active Problem List   Diagnosis Date Noted  . Abnormal findings on diagnostic imaging of heart and coronary circulation   . Pulmonary nodule, left 04/18/2018  . Hypercholesterolemia 04/18/2018  . Non-rheumatic mitral regurgitation 04/18/2018  . Right thyroid nodule 04/18/2018  . Mitral valve prolapse 04/18/2018  . Precordial chest pain 02/11/2018  . Celiac artery stenosis (HCC) 02/11/2018  . Abnormal heart sounds 02/11/2018  . Coronary atherosclerosis 02/11/2018  . Atherosclerosis of aorta (HCC) 02/11/2018  . Severely underweight adult 02/11/2018  . Hypotension 02/11/2018    Past Surgical History:  Procedure Laterality Date  . HERNIA REPAIR     x3  . HERNIA REPAIR     x 3  . KNEE SURGERY    . LEFT HEART CATH AND CORONARY ANGIOGRAPHY N/A 04/20/2018   Procedure: LEFT HEART CATH AND CORONARY ANGIOGRAPHY;  Surgeon: SwazilandJordan, Peter M, MD;  Location[facility] INVASIVE CV LAB;  Service: Cardiovascular;  Laterality: N/A;  . SHOULDER SURGERY    . SHOULDER SURGERY Right 2016  . TONSILLECTOMY AND ADENOIDECTOMY     83 yrs old  . TONSILLECTOMY AND ADENOIDECTOMY     Childhood        Home  Medications    Prior to Admission medications   Medication Sig Start Date End Date Taking? Authorizing Provider  aspirin EC 81 MG tablet Take 1 tablet (81 mg total) by mouth daily. 05/03/18   Azalee CourseMeng, Hao, PA  atorvastatin (LIPITOR) 20 MG tablet Take 1 tablet (20 mg total) by mouth daily. 05/03/18 08/31/18  Azalee CourseMeng, Hao, PA  Carboxymethylcellulose Sod PF (REFRESH CELLUVISC) 1 % GEL Place 1 drop into both eyes daily.    [provider]  Carboxymethylcellulose Sod PF (REFRESH PLUS) 0.5 % SOLN Place 1 drop into both eyes 4 (four) times daily.    [provider]  cephALEXin (KEFLEX) 500 MG capsule Take 1 capsule (500 mg total) by mouth 4 (four) times daily. 06/20/18   Jacalyn LefevreHaviland, Hina Gupta, MD  Cholecalciferol (VITAMIN D3) 2000 units capsule Take 2,000 Units by mouth daily.     [provider]  nitroGLYCERIN (NITROSTAT) 0.4 MG SL tablet Place 1 tablet (0.4 mg total) under the tongue every 5 (five) minutes as needed for chest pain. 05/03/18 08/01/18  Azalee CourseMeng, Hao, PA    Family History Family History  Problem Relation Age of Onset  . Hypertension Mother   . Cancer - Colon Mother   . Hypertension Father   . Gallbladder disease Father   . Heart attack Father  Social History Social History   Tobacco Use  . Smoking status: Never Smoker  . Smokeless tobacco: Never Used  Substance Use Topics  . Alcohol use: Yes    Comment: occ  . Drug use: Never     Allergies   Shellfish allergy   Review of Systems Review of Systems  Genitourinary: Positive for dysuria and frequency.  All other systems reviewed and are negative.    Physical Exam Updated Vital Signs BP 104/60 (BP Location: Right Arm)   Pulse 77   Temp 98.4 F (36.9 C) (Oral)   Resp 18   Ht 6' (1.829 m)   Wt 58.1 kg   SpO2 100%   BMI 17.36 kg/m   Physical Exam Vitals signs and nursing note reviewed.  Constitutional:      Appearance: Normal appearance. He is underweight.  HENT:     Head: Normocephalic and  atraumatic.     Right Ear: External ear normal.     Left Ear: External ear normal.     Nose: Nose normal.     Mouth/Throat:     Mouth: Mucous membranes are moist.  Eyes:     Pupils: Pupils are equal, round, and reactive to light.  Neck:     Musculoskeletal: Normal range of motion and neck supple.  Cardiovascular:     Rate and Rhythm: Normal rate and regular rhythm.     Pulses: Normal pulses.     Heart sounds: Normal heart sounds.  Pulmonary:     Effort: Pulmonary effort is normal.     Breath sounds: Normal breath sounds.  Abdominal:     General: Abdomen is flat.     Tenderness: There is abdominal tenderness in the suprapubic area.  Musculoskeletal: Normal range of motion.  Skin:    General: Skin is warm and dry.     Capillary Refill: Capillary refill takes less than 2 seconds.  Neurological:     General: No focal deficit present.     Mental Status: He is alert and oriented to person, place, and time.  Psychiatric:        Mood and Affect: Mood normal.        Behavior: Behavior normal.      ED Treatments / Results  Labs (all labs ordered are listed, but only abnormal results are displayed) Labs Reviewed  URINALYSIS, ROUTINE W REFLEX MICROSCOPIC - Abnormal; Notable for the following components:      Result Value   APPearance CLOUDY (*)    Hgb urine dipstick MODERATE (*)    Nitrite POSITIVE (*)    Leukocytes, UA SMALL (*)    All other components within normal limits  URINALYSIS, MICROSCOPIC (REFLEX) - Abnormal; Notable for the following components:   Bacteria, UA MANY (*)    All other components within normal limits  URINE CULTURE    EKG None  Radiology No results found.  Procedures Procedures (including critical care time)  Medications Ordered in ED Medications  cefTRIAXone (ROCEPHIN) injection 1 g (1 g Intramuscular Given 06/20/18 1525)     Initial Impression / Assessment and Plan / ED Course  I have reviewed the triage vital signs and the nursing  notes.  Pertinent labs & imaging results that were available during my care of the patient were reviewed by me and considered in my medical decision making (see chart for details).    Pt does have an UTI.  Urine will be sent for culture.  He is given a dose of rocephin here.  He will be d/c home with a rx for keflex.  Return if worse.    Final Clinical Impressions(s) / ED Diagnoses   Final diagnoses:  Acute cystitis with hematuria    ED Discharge Orders         Ordered    cephALEXin (KEFLEX) 500 MG capsule  4 times daily,   Status:  Discontinued     06/20/18 1527    cephALEXin (KEFLEX) 500 MG capsule  4 times daily     06/20/18 1541           Jacalyn Lefevre, MD 06/20/18 1547

## 2018-06-22 LAB — URINE CULTURE

## 2018-06-23 ENCOUNTER — Telehealth: Payer: Self-pay

## 2018-06-23 ENCOUNTER — Telehealth: Payer: Self-pay | Admitting: Cardiovascular Disease

## 2018-06-23 NOTE — Progress Notes (Signed)
ED Antimicrobial Stewardship Positive Culture Follow Up   Jerry Roth is an 83 y.o. male who presented to [facility] on 06/20/2018 with a chief complaint of  Chief Complaint  Patient presents with  . Dysuria    Recent Results (from the past 720 hour(s))  Urine culture     Status: Abnormal   Collection Time: 06/20/18  1:55 PM  Result Value Ref Range Status   Specimen Description   Final    URINE, CLEAN CATCH Performed at [facility], [address] [place], [place] [address]    Special Requests   Final    NONE Performed at [facility], [address] [place], [place] [address]    Culture >=100,000 COLONIES/mL CITROBACTER FREUNDII (A)  Final   Report Status 06/22/2018 FINAL  Final   Organism ID, Bacteria CITROBACTER FREUNDII (A)  Final      Susceptibility   Citrobacter freundii - MIC*    CEFAZOLIN >=64 RESISTANT Resistant     CEFTRIAXONE <=1 SENSITIVE Sensitive     CIPROFLOXACIN <=0.25 SENSITIVE Sensitive     GENTAMICIN <=1 SENSITIVE Sensitive     IMIPENEM <=0.25 SENSITIVE Sensitive     NITROFURANTOIN <=16 SENSITIVE Sensitive     TRIMETH/SULFA <=20 SENSITIVE Sensitive     PIP/TAZO <=4 SENSITIVE Sensitive     * >=100,000 COLONIES/mL CITROBACTER FREUNDII    [x]  Treated with cephalexin, organism resistant to prescribed antimicrobial []  Patient discharged originally without antimicrobial agent and treatment is now indicated  New antibiotic prescription: TMP/SMX DS (800/160) BID x 7 days  ED Provider: Clayborn Heron, PharmD PGY1 Pharmacy Resident 06/23/2018    9:53 AM Monday - Friday phone -  [phone] Saturday - Sunday phone - [phone]

## 2018-06-23 NOTE — Telephone Encounter (Signed)
Spoke with pt. Pt sts that he will need to have fasting lipid and lft in about a week. Adv pt that cephalexin will not interfere with his lab results. Reminded pt that the labs are fasting. Pt voiced appreciation for the call back and assistance and verbalizes understanding.

## 2018-06-23 NOTE — Telephone Encounter (Signed)
New message   Pt c/o medication issue:  1. Name of Medication: Cephalexin  2. How are you currently taking this medication (dosage and times per day)? 500 mg capsules 4 times daily.  3. Are you having a reaction (difficulty breathing--STAT)? No   4. What is your medication issue? Patient is going to have lab work done and wants to know if this antibiotic will interfere with his test.

## 2018-06-23 NOTE — Telephone Encounter (Signed)
Called for symptom check per [facility]. Denies any urinary symptoms. Feels much better. Instructed to stop Cephalexin since resistant to UC.  Offered to order new abx if he needs  Did not want to order Bactrim DS  Didn't feel he needs.

## 2018-06-30 LAB — HEPATIC FUNCTION PANEL
ALT: 44 IU/L (ref 0–44)
AST: 54 IU/L — ABNORMAL HIGH (ref 0–40)
Albumin: 4.2 g/dL (ref 3.5–4.7)
Alkaline Phosphatase: 91 IU/L (ref 39–117)
BILIRUBIN TOTAL: 0.5 mg/dL (ref 0.0–1.2)
BILIRUBIN, DIRECT: 0.17 mg/dL (ref 0.00–0.40)
TOTAL PROTEIN: 6.7 g/dL (ref 6.0–8.5)

## 2018-06-30 LAB — LIPID PANEL
CHOL/HDL RATIO: 2 ratio (ref 0.0–5.0)
Cholesterol, Total: 134 mg/dL (ref 100–199)
HDL: 68 mg/dL (ref >39)
LDL Calculated: 58 mg/dL (ref 0–99)
Triglycerides: 38 mg/dL (ref 0–149)
VLDL Cholesterol Cal: 8 mg/dL (ref 5–40)

## 2018-07-25 ENCOUNTER — Other Ambulatory Visit: Payer: Self-pay

## 2018-07-25 ENCOUNTER — Encounter: Payer: Self-pay | Admitting: Cardiovascular Disease

## 2018-07-25 ENCOUNTER — Ambulatory Visit (INDEPENDENT_AMBULATORY_CARE_PROVIDER_SITE_OTHER): Payer: Medicare Other | Admitting: Cardiovascular Disease

## 2018-07-25 VITALS — BP 100/64 | HR 49 | Ht 72.0 in | Wt 125.0 lb

## 2018-07-25 DIAGNOSIS — I774 Celiac artery compression syndrome: Secondary | ICD-10-CM

## 2018-07-25 DIAGNOSIS — E041 Nontoxic single thyroid nodule: Secondary | ICD-10-CM

## 2018-07-25 DIAGNOSIS — I9589 Other hypotension: Secondary | ICD-10-CM

## 2018-07-25 DIAGNOSIS — Z79899 Other long term (current) drug therapy: Secondary | ICD-10-CM

## 2018-07-25 DIAGNOSIS — R636 Underweight: Secondary | ICD-10-CM

## 2018-07-25 DIAGNOSIS — R911 Solitary pulmonary nodule: Secondary | ICD-10-CM

## 2018-07-25 DIAGNOSIS — I771 Stricture of artery: Secondary | ICD-10-CM

## 2018-07-25 DIAGNOSIS — E7800 Pure hypercholesterolemia, unspecified: Secondary | ICD-10-CM

## 2018-07-25 DIAGNOSIS — I341 Nonrheumatic mitral (valve) prolapse: Secondary | ICD-10-CM

## 2018-07-25 DIAGNOSIS — I2510 Atherosclerotic heart disease of native coronary artery without angina pectoris: Secondary | ICD-10-CM

## 2018-07-25 NOTE — Progress Notes (Signed)
Cardiology Office Note:    Date:  07/26/2018   ID:  Jerry Roth, DOB [birth year], MRN [id number]  PCP:  Nadara Eaton, MD  Cardiologist:  No primary care provider on file.   Referring MD: Nadara Eaton, MD   Chief Complaint  Patient presents with  . Coronary Artery Disease    History of Present Illness:    Jerry Roth is a 83 y.o. male with a hx of celiac artery stenosis who was evaluated for an episode of chest pain in June.  He underwent a chest CT coronary angiogram that showed severe stenosis in a oblique marginal branch of the circumflex coronary artery as the only significant lesion.  The vessel was very tortuous and percutaneous revascularization was thought to be challenging.  Since he has been asymptomatic since the initial presentation in June, the decision was made to manage this medically.  His calcium score placed him in the 60th percentile for age and gender.  He has severe celiac artery stenosis with a widely patent superior and inferior mesenteric arteries and without intestinal angina.  There was no renal artery stenosis.  His echocardiogram showed normal left ventricular systolic function with EF 60-65% and mild prolapse of the posterior mitral leaflet with mild to moderate regurgitation and a mildly dilated left atrium.  Incidental findings which were also described on his noncardiac CT from June include the area of ground glass opacities with some nodularity in the lingula, bilateral apical lung scarring and a 2 cm nodule in the right lobe of the thyroid.  As far as I can tell these are unchanged, nonprogressive findings.  Lipid profile from January 2020 shows a total cholesterol 134, triglycerides 38, HDL 68, LDL 58.  After learning of his diagnosis of coronary stenosis he went on a 0 sugar diet.  Unfortunately he has lost additional weight.  His BMI is now only 17.  Thankfully he has decided to start seeing a dietitian.  He does not have diarrhea,  intestinal angina or other signs of malabsorption.  He is originally from [place] and is currently a resident at the [facility] facility with his wife.  They plan to move to the [place] area in a couple of months.  There is no family history of coronary disease.  His father had a cardiac arrest after undergoing cholecystectomy.  His sister underwent surgery for 2 heart valves (1 repaired, 1 replaced) about a year ago.  Past Medical History:  Diagnosis Date  . Celiac artery stenosis (HCC)   . Multiple thyroid nodules    Benign on biopsy    Past Surgical History:  Procedure Laterality Date  . HERNIA REPAIR     x3  . HERNIA REPAIR     x 3  . KNEE SURGERY    . LEFT HEART CATH AND CORONARY ANGIOGRAPHY N/A 04/20/2018   Procedure: LEFT HEART CATH AND CORONARY ANGIOGRAPHY;  Surgeon: Swaziland, Peter M, MD;  Location: [facility] INVASIVE CV LAB;  Service: Cardiovascular;  Laterality: N/A;  . SHOULDER SURGERY    . SHOULDER SURGERY Right 2016  . TONSILLECTOMY AND ADENOIDECTOMY     83 yrs old  . TONSILLECTOMY AND ADENOIDECTOMY     Childhood    Current Medications: Current Meds  Medication Sig  . aspirin EC 81 MG tablet Take 1 tablet (81 mg total) by mouth daily.  Marland Kitchen atorvastatin (LIPITOR) 20 MG tablet Take 1 tablet (20 mg total) by mouth daily.  . Carboxymethylcellulose Sod PF (REFRESH CELLUVISC) 1 %  GEL Place 1 drop into both eyes daily.  . Carboxymethylcellulose Sod PF (REFRESH PLUS) 0.5 % SOLN Place 1 drop into both eyes 4 (four) times daily.  . cephALEXin (KEFLEX) 500 MG capsule Take 1 capsule (500 mg total) by mouth 4 (four) times daily.  . Cholecalciferol (VITAMIN D3) 2000 units capsule Take 2,000 Units by mouth daily.   . nitroGLYCERIN (NITROSTAT) 0.4 MG SL tablet Place 1 tablet (0.4 mg total) under the tongue every 5 (five) minutes as needed for chest pain.     Allergies:   Shellfish allergy   Social History   Socioeconomic History  . Marital status: Married    Spouse  name: Not on file  . Number of children: Not on file  . Years of education: Not on file  . Highest education level: Not on file  Occupational History  . Not on file  Social Needs  . Financial resource strain: Not on file  . Food insecurity:    Worry: Not on file    Inability: Not on file  . Transportation needs:    Medical: Not on file    Non-medical: Not on file  Tobacco Use  . Smoking status: Never Smoker  . Smokeless tobacco: Never Used  Substance and Sexual Activity  . Alcohol use: Yes    Comment: occ  . Drug use: Never  . Sexual activity: Not on file  Lifestyle  . Physical activity:    Days per week: Not on file    Minutes per session: Not on file  . Stress: Not on file  Relationships  . Social connections:    Talks on phone: Not on file    Gets together: Not on file    Attends religious service: Not on file    Active member of club or organization: Not on file    Attends meetings of clubs or organizations: Not on file    Relationship status: Not on file  Other Topics Concern  . Not on file  Social History Narrative  . Not on file     Family History: The patient's family history includes Cancer - Colon in his mother; Gallbladder disease in his father; Heart attack in his father; Hypertension in his father and mother.  ROS:   Please see the history of present illness.    All other systems are reviewed and are negative EKGs/Labs/Other Studies Reviewed:    The following studies were reviewed today: Cardiac catheterization  EKG:  EKG is not ordered today.  The ekg ordered today at his last appointment sinus bradycardia, normal tracing, QTC 419 ms, no repolarization abnormalities  Recent Labs: 04/19/2018: BUN 15; Creatinine, Ser 1.00; Hemoglobin 13.0; Platelets 154; Potassium 4.3; Sodium 140 06/29/2018: ALT 44  Recent Lipid Panel Lipid Panel     Component Value Date/Time   CHOL 134 06/29/2018 0847   TRIG 38 06/29/2018 0847   HDL 68 06/29/2018 0847    CHOLHDL 2.0 06/29/2018 0847   LDLCALC 58 06/29/2018 0847     Physical Exam:    VS:  BP 100/64 (BP Location: Right Arm, Patient Position: Sitting, Cuff Size: Normal)   Pulse (!) 49   Ht 6' (1.829 m)   Wt 125 lb (56.7 kg)   SpO2 93%   BMI 16.95 kg/m      Wt Readings from Last 3 Encounters:  07/25/18 125 lb (56.7 kg)  06/20/18 128 lb (58.1 kg)  05/03/18 134 lb 3.2 oz (60.9 kg)      General: Alert,  oriented x3, no distress, appears malnourished, extremely lean Head: no evidence of trauma, PERRL, EOMI, no exophtalmos or lid lag, no myxedema, no xanthelasma; normal ears, nose and oropharynx Neck: normal jugular venous pulsations and no hepatojugular reflux; brisk carotid pulses without delay and no carotid bruits Chest: clear to auscultation, no signs of consolidation by percussion or palpation, normal fremitus, symmetrical and full respiratory excursions Cardiovascular: normal position and quality of the apical impulse, regular rhythm, normal first and second heart sounds, he has a midsystolic click and a late systolic murmur that intensify with a Valsalva maneuver, no diastolic murmurs, rubs or gallops Abdomen: no tenderness or distention, no masses by palpation, no abnormal pulsatility or arterial bruits, normal bowel sounds, no hepatosplenomegaly Extremities: no clubbing, cyanosis or edema; 2+ radial, ulnar and brachial pulses bilaterally; 2+ right femoral, posterior tibial and dorsalis pedis pulses; 2+ left femoral, posterior tibial and dorsalis pedis pulses; no subclavian or femoral bruits Neurological: grossly nonfocal Psych: Normal mood and affect    ASSESSMENT:    1. Coronary artery disease involving native coronary artery of native heart without angina pectoris   2. Other specified hypotension   3. Celiac artery stenosis (HCC)   4. Underweight   5. Right thyroid nodule   6. Mitral valve prolapse   7. Hypercholesterolemia   8. Pulmonary nodule   9. Medication  management    PLAN:    In order of problems listed above:  1. CAD: Average calcium score, single-vessel disease by angiography with involvement of a rather tortuous OM branch of the left circumflex, difficult to perform percutaneous revascularization.  Since he is currently asymptomatic, plan medical therapy. 2. Hypotension: His blood pressure remains rather low, but he has no symptoms of hypotension. 3. Celiac artery stenosis: Despite the severity of the celiac artery stenosis, it would be unexpected to cause severe weight loss in the absence of concurrent superior mesenteric artery stenosis.  He should not be on a low calorie/low sugar diet in my opinion.  I encouraged him to continue to follow-up with his dietitian.  I am pleased to hear that he is eating some high protein shakes. 4. Underweight: Remains unexplained.  His wife raises the concern for celiac disease.  Mr. Tomasa RandCunningham reports that he thinks he had a small bowel biopsy that was negative. 5. Right thyroid nodule: Follow-up with his endocrinologist, Dr. Allena KatzPatel 6. MVP/MR: Asymptomatic mitral valve prolapse with mild-moderate mitral insufficiency, no indication for serial echocardiograms unless there is evidence of new cardiomegaly, his murmur changes or he has a clinical manifestations of mitral insufficiency (dyspnea). 7. HLP: Excellent LDL cholesterol level on low-dose atorvastatin.  Target LDL less than 70.  Prior to initiation of therapy it was around 100.  He would like to see where the LDL is now without medications.  His wife would rather that he stay on the statin and I agree with her.  We however agreed that 3 months off therapy will not make a big difference in the long-term.  Would strongly recommend restarting statin if LDL is >70 off medication 8. Pulmonary nodule: Per the radiology recommendations he should have a repeat CT of the chest to reevaluate the groundglass opacity and subcentimeter nodule in the lingula in mid April  2019.  By then he will probably have moved to [place].  We will be glad to send all his records to providers in [place].   Medication Adjustments/Labs and Tests Ordered: Current medicines are reviewed at length with the patient today.  Concerns  regarding medicines are outlined above.  Orders Placed This Encounter  Procedures  . Lipid panel   No orders of the defined types were placed in this encounter.   Patient Instructions  Medication Instructions:  The current medical regimen is effective;  continue present plan and medications.  If you need a refill on your cardiac medications before your next appointment, please call your pharmacy.   Lab work: Lipid panel in 3 months If you have labs (blood work) drawn today and your tests are completely normal, you will receive your results only by: Marland Kitchen. MyChart Message (if you have MyChart) OR . A paper copy in the mail If you have any lab test that is abnormal or we need to change your treatment, we will call you to review the results.  Follow-Up: At [facility], you and your health needs are our priority.  As part of our continuing mission to provide you with exceptional heart care, we have created designated Provider Care Teams.  These Care Teams include your primary Cardiologist (physician) and Advanced Practice Providers (APPs -  Physician Assistants and Nurse Practitioners) who all work together to provide you with the care you need, when you need it. You will need a follow up appointment in 6 months.  Please call our office 2 months in advance to schedule this appointment.  You may see Dr.Asenath Balash or one of the following Advanced Practice Providers on your designated Care Team: Azalee CourseHao Meng, [place]PA-C . Micah FlesherAngela Duke, PA-C        Signed, Thurmon FairMihai Philopateer Strine, MD  07/26/2018 2:45 PM    [facility]\

## 2018-07-25 NOTE — Patient Instructions (Addendum)
Medication Instructions:  The current medical regimen is effective;  continue present plan and medications.  If you need a refill on your cardiac medications before your next appointment, please call your pharmacy.   Lab work: Lipid panel in 3 months If you have labs (blood work) drawn today and your tests are completely normal, you will receive your results only by: Marland Kitchen MyChart Message (if you have MyChart) OR . A paper copy in the mail If you have any lab test that is abnormal or we need to change your treatment, we will call you to review the results.  Follow-Up: At [facility], you and your health needs are our priority.  As part of our continuing mission to provide you with exceptional heart care, we have created designated Provider Care Teams.  These Care Teams include your primary Cardiologist (physician) and Advanced Practice Providers (APPs -  Physician Assistants and Nurse Practitioners) who all work together to provide you with the care you need, when you need it. You will need a follow up appointment in 6 months.  Please call our office 2 months in advance to schedule this appointment.  You may see Dr.Croitoru or one of the following Advanced Practice Providers on your designated Care Team: Azalee Course, [place] . Micah Flesher, PA-C

## 2018-07-26 ENCOUNTER — Encounter: Payer: Self-pay | Admitting: Cardiovascular Disease

## 2018-08-25 ENCOUNTER — Other Ambulatory Visit: Payer: Self-pay

## 2018-08-25 MED ORDER — ATORVASTATIN CALCIUM 20 MG PO TABS: 20.0000 mg | ORAL_TABLET | Freq: Every day | ORAL | 11 refills | Status: DC

## 2018-08-25 NOTE — Telephone Encounter (Signed)
Rx(s) sent to pharmacy electronically.  

## 2018-10-24 IMAGING — DX DG CHEST 2V
2 series · 2 of 2 positions shown · non-contrast
Comparison: None.

CLINICAL DATA: Left-sided chest pain.

EXAM:
CHEST - 2 VIEW

[chest pa]
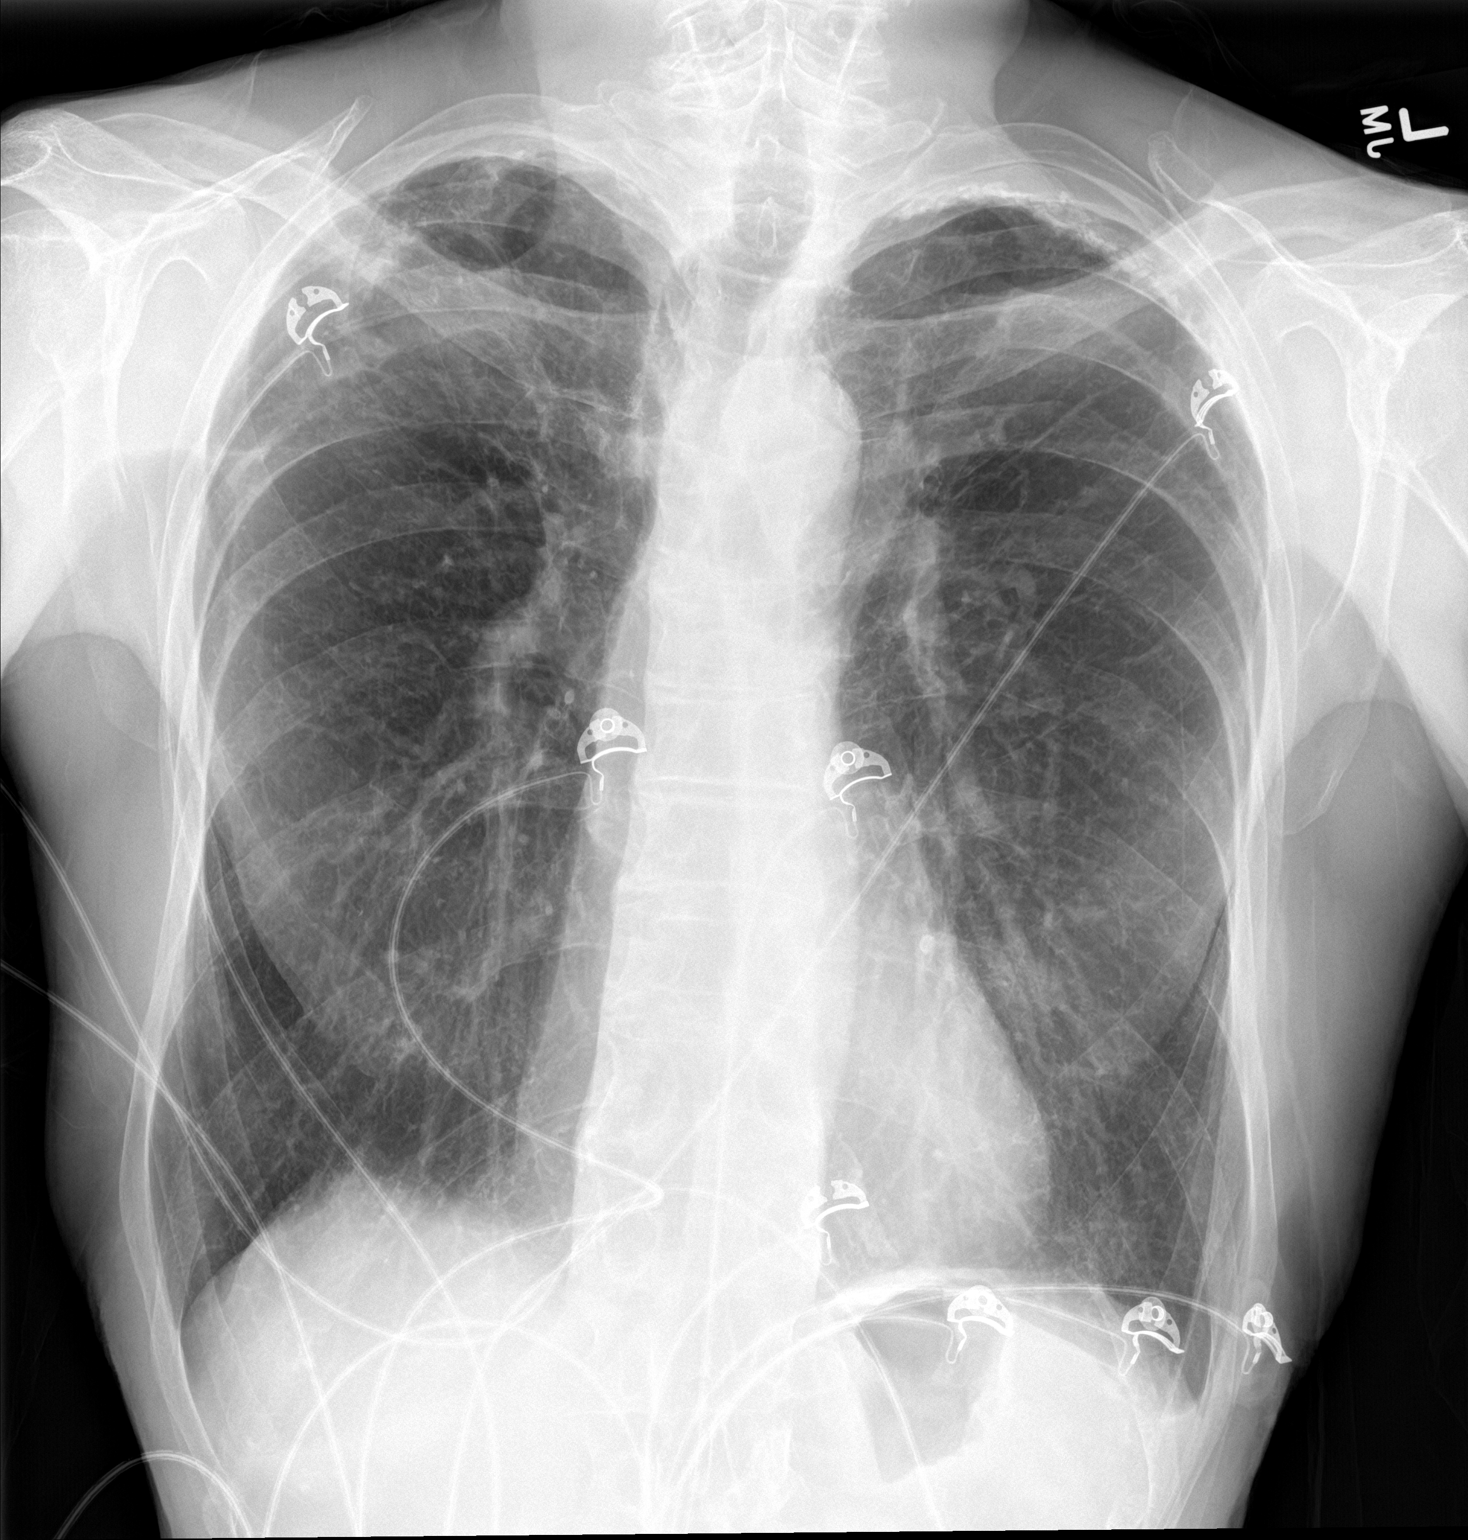

[chest lat]
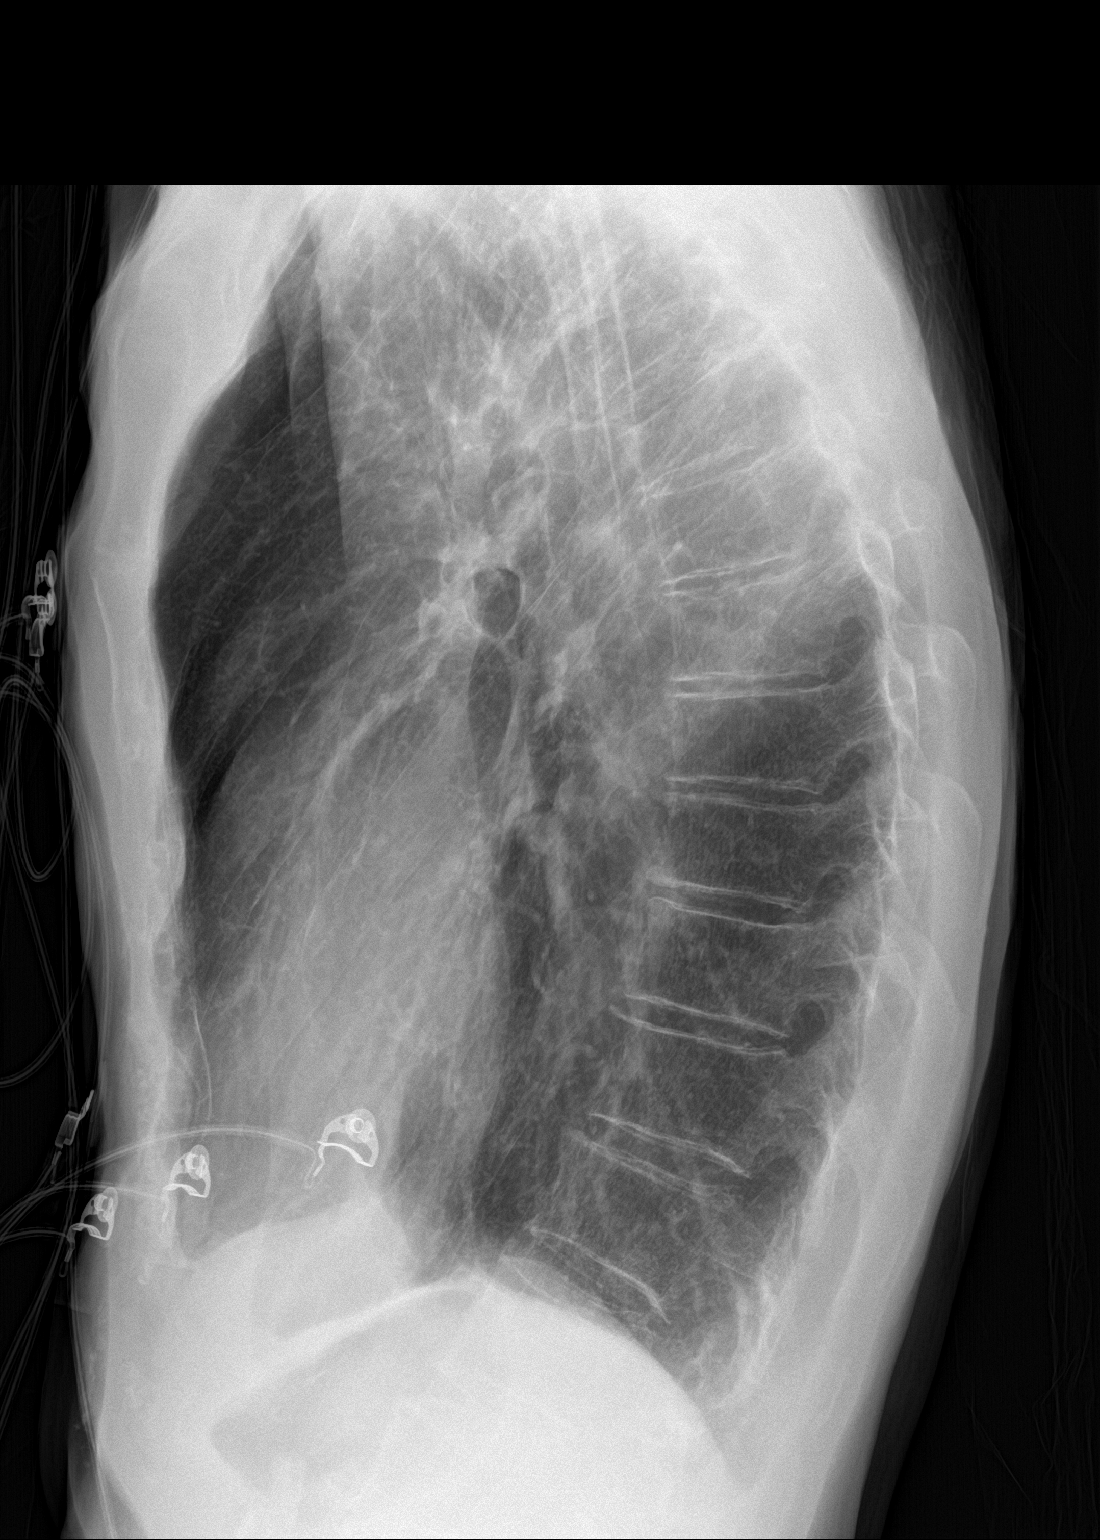

[2 of 2 positions shown; findings below may reference images not displayed]

FINDINGS: Opacity in the right apex may be scarring but is indeterminate. Soft
tissue thickening with associated calcification in the left apex is
likely pleuroparenchymal change. No pneumothorax. The heart, hila,
mediastinum are normal. Hyperinflation of the lungs identified.
Pleural thickening versus tiny effusions. Pleural thickening
favored. No other acute abnormalities.
IMPRESSION: 1. Hyperinflation of the lungs consistent with COPD/emphysema.
2. Opacity in the right apex is age indeterminate. While this
finding could represent scarring, there is a more nodular component.
Recommend comparison to outside films if available. If none are
available, recommend CT imaging for further assessment.
3. Increased soft tissue at the left apex with calcifications likely
pleuroparenchymal change/scarring. Again, comparison to old films
would be beneficial.

## 2018-10-24 IMAGING — CT CT CHEST W/ CM
2 of 3 series · 14 of 36 positions shown, 17 images · IV contrast (iopamidol)
Comparison: Chest radiograph November 18, 2017

CLINICAL DATA: Chest pain.  Abnormal chest radiograph

EXAM:
CT CHEST WITH CONTRAST
TECHNIQUE: Multidetector CT imaging of the chest was performed during
intravenous contrast administration.
CONTRAST:  80mL PTMJOJ-366 IOPAMIDOL (PTMJOJ-366) INJECTION 61%

[Series 2: axial st · axial · 0.79mm/px · z∈[-332,-18]mm · 11 of 185 slices shown, 14 images]
[im 14/185  mediastinal]
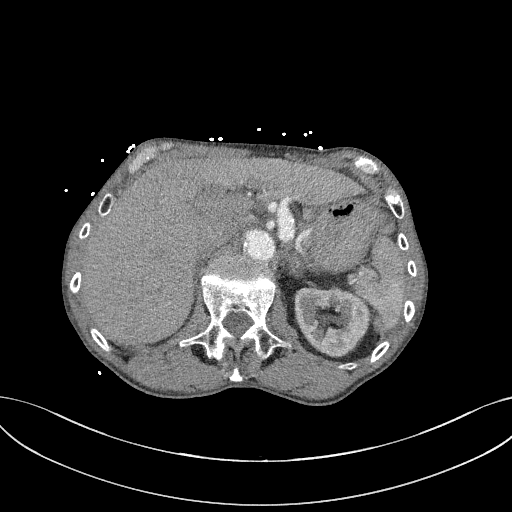
[im 14/185  lung]
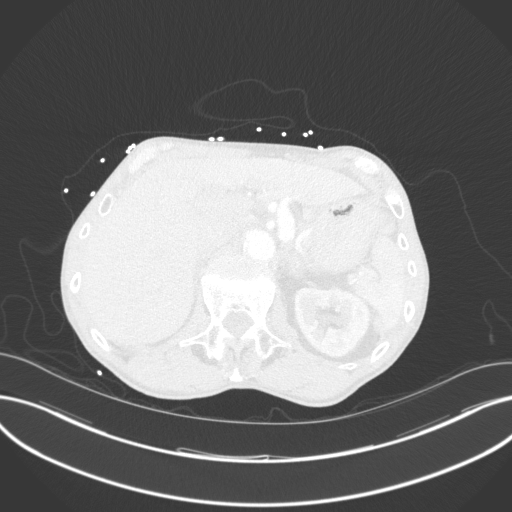
[im 28/185  lung]
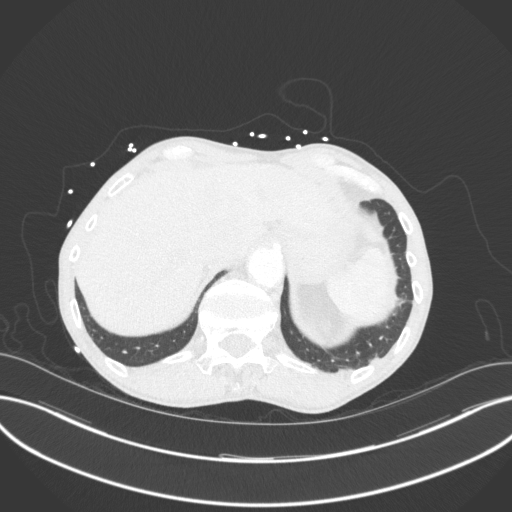
[im 41/185  lung]
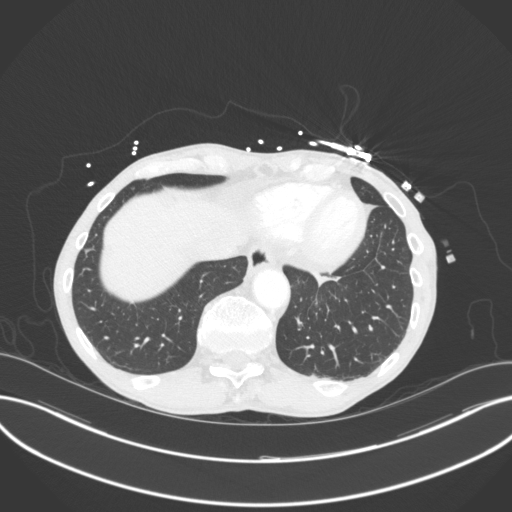
[im 62/185  lung]
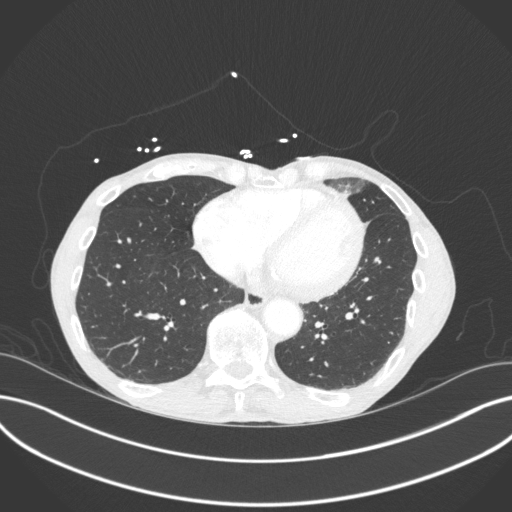
[im 75/185  mediastinal]
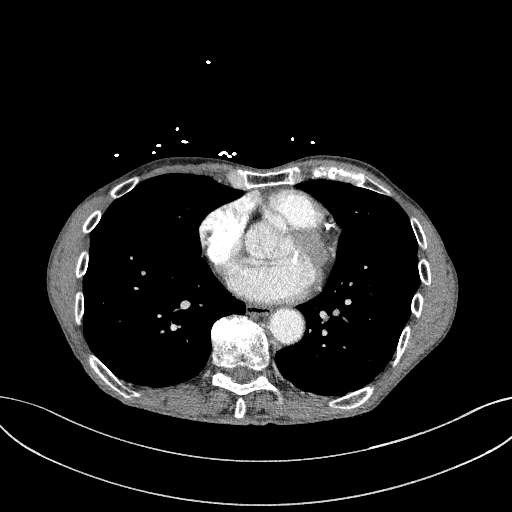
[im 75/185  lung]
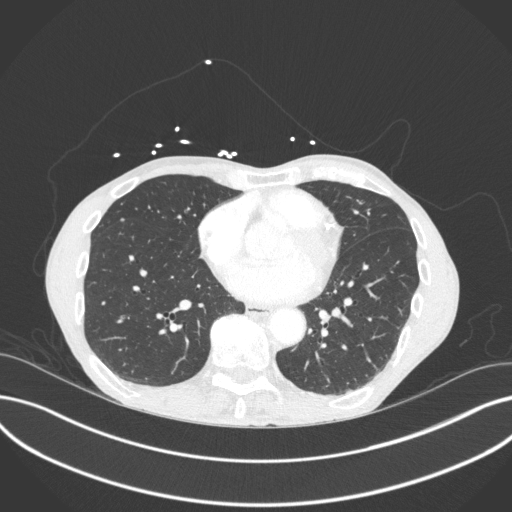
[im 96/185  lung]
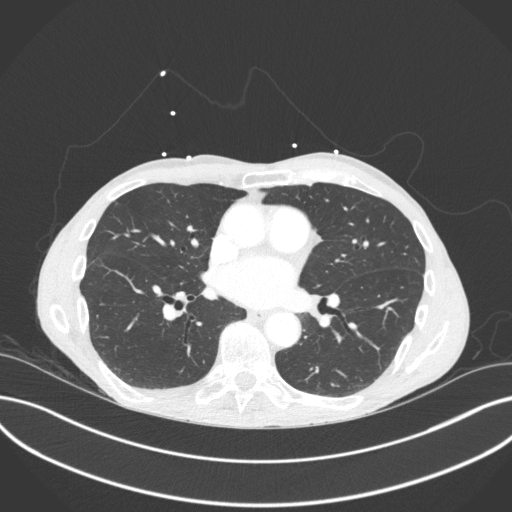
[im 110/185  lung]
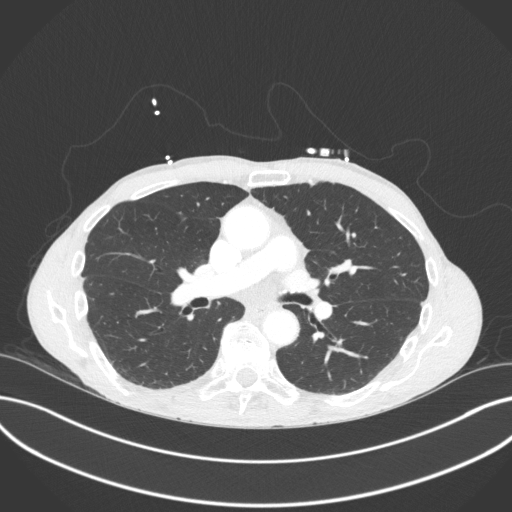
[im 123/185  lung]
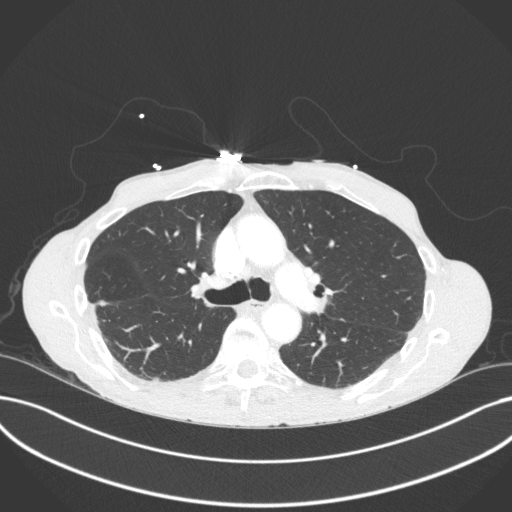
[im 144/185  mediastinal]
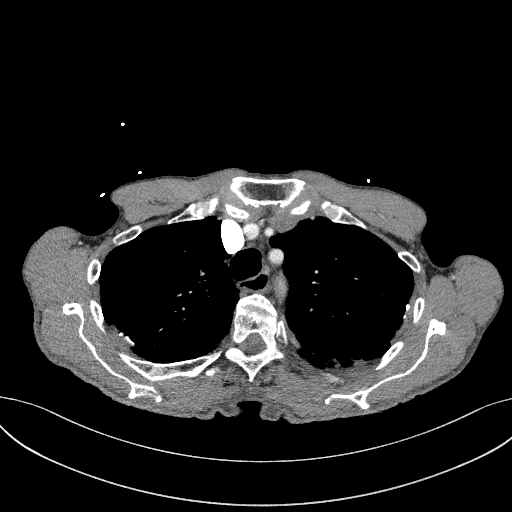
[im 144/185  lung]
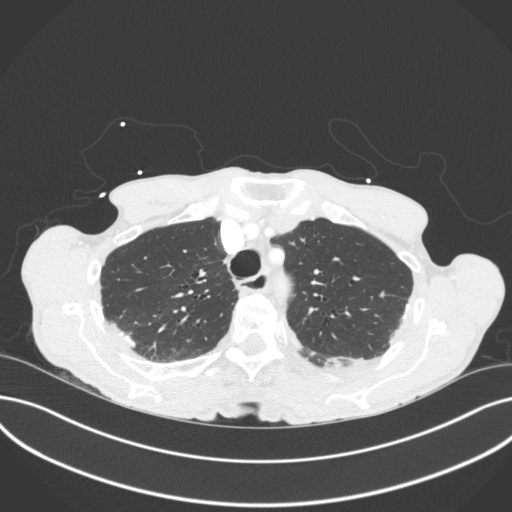
[im 157/185  lung]
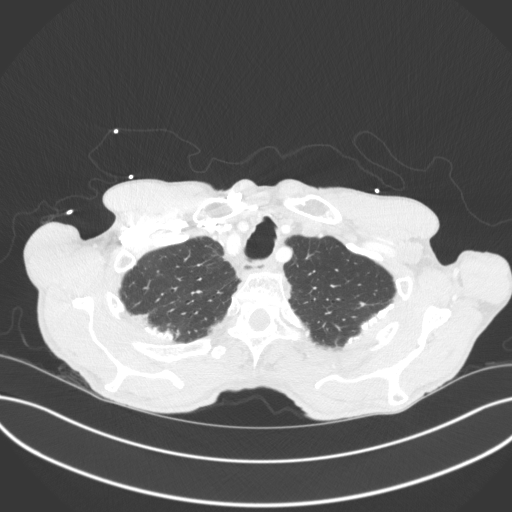
[im 171/185  lung]
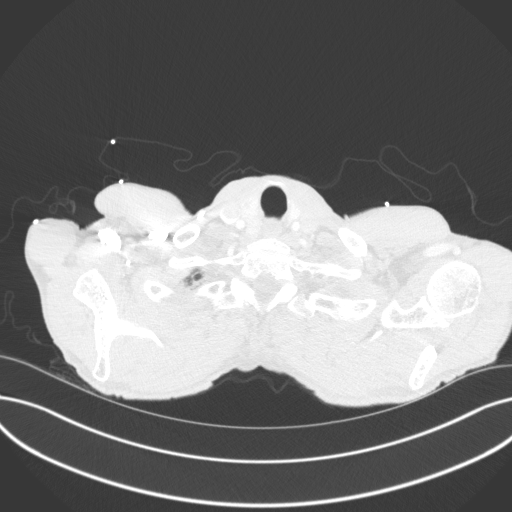

[Series 5: coronal · coronal · 0.67mm/px · 3 of 114 slices shown]
[im 23/114  lung]
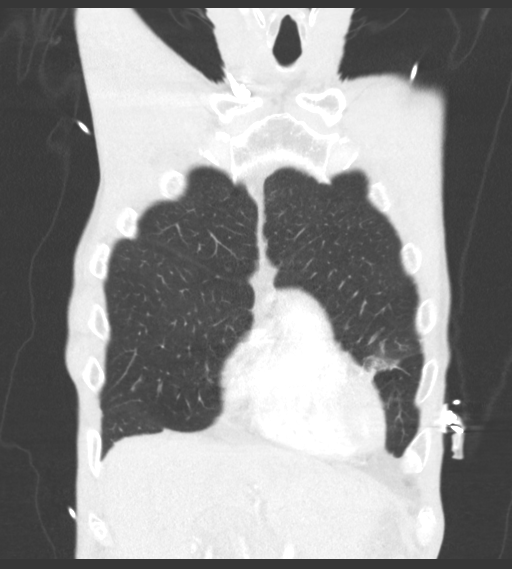
[im 46/114  lung]
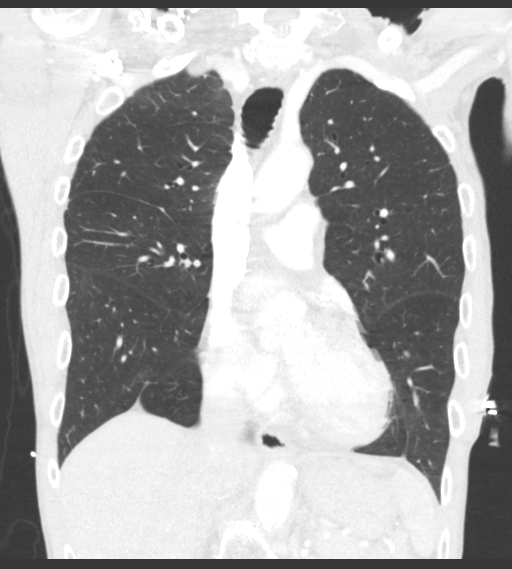
[im 68/114  lung]
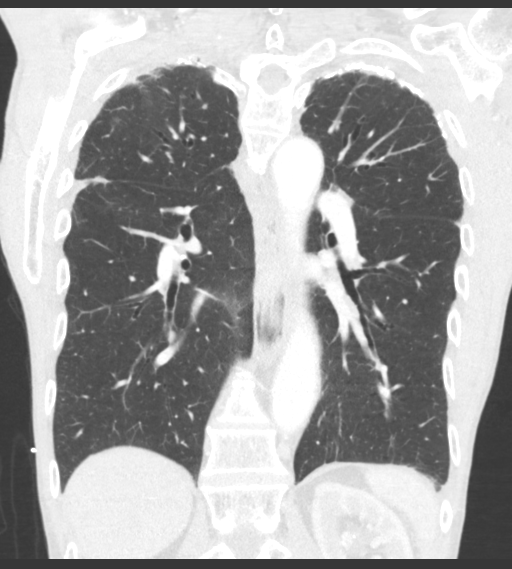

[14 of 36 positions shown; findings below may reference images not displayed]

FINDINGS: Cardiovascular: There is no demonstrable thoracic aortic aneurysm or
dissection. The visualized great vessels appear normal except for
slight calcification in the right proximal subclavian artery. There
is aortic atherosclerosis. There are foci of coronary artery
calcification. No pericardial pericardial thickening evident. A
small amount of pericardial fluid is felt to be within physiologic
limits.

Mediastinum/Nodes: There are nodular opacities throughout a rather
prominent right lobe of the thyroid, largest measuring 2.0 x 1.7 cm.

There is no appreciable thoracic adenopathy. No esophageal lesions
are demonstrable. There is moderate air in the esophagus.

Lungs/Pleura: There is apical pleural thickening with calcification
along the apices bilaterally. There is slightly more scarring on the
right than on the left. There is no appreciable nodular lesion in
either apex region. There is mild atelectasis in each lower lobe.
There is a ground-glass type opacity in the inferior lingula which
abuts the anterior pleura on the left inferiorly as well as the left
pericardium. This ground-glass type opacity measures 3.1 x 2.5 x
cm. In the inferior aspect of this area of ground-glass opacity,
there is a 6 x 6 mm nodular component. No similar changes are
evident elsewhere. No frank consolidation. No pleural effusion
evident. Note that there is felt to be a degree of underlying
emphysematous change.

Upper Abdomen: There is atherosclerotic calcification in the
abdominal aorta and visualized great vessels. There is incomplete
visualization of a cystic structure in the right upper quadrant
which likely arises from the upper pole of the right kidney. This
cystic area measures 3.1 x 1.8 cm. Visualized upper abdominal
structures otherwise appear unremarkable.

Musculoskeletal: There are no blastic or lytic bone lesions. No
evident chest wall lesions.
IMPRESSION: 1. There is a ground-glass type opacity in the inferior lingula
measuring 3.1 x 2.5 x 2.3 cm. There is a 6 mm nodular component
within the inferior aspect of this lesion. Follow-up non-contrast CT
recommended at 3-6 months to confirm persistence. If unchanged, and
solid component remains <6 mm, annual CT is recommended until 5
years of stability has been established. If persistent these nodules
should be considered highly suspicious if the solid component of the
nodule is 6 mm or greater in size and enlarging. This recommendation
follows the consensus statement: Guidelines for Management of
Incidental Pulmonary Nodules Detected on CT Images: From the

2. Apical scarring with apical thickening and calcification, likely
of inflammatory etiology. No nodular appearing or masslike area seen
in either apex.

3.  There is a degree of underlying emphysematous change.

4.  No evident thoracic adenopathy.

5. **An incidental finding of potential clinical significance has
been found. Nodular lesions in the right lobe of the thyroid with a
dominant lesion measuring 2.0 x 1.7 cm. Consider further evaluation
with thyroid ultrasound. If patient is clinically hyperthyroid,
consider nuclear medicine thyroid uptake and scan.**

6. Aortic atherosclerosis. No thoracic aortic aneurysm or
dissection. No major vessel pulmonary embolus evident. There are
foci of coronary artery calcification.

7. Small amount of pericardial effusion, likely within physiologic
range.

Aortic Atherosclerosis (MDZHJ-8IF.F) and Emphysema (MDZHJ-KRK.S).

## 2018-11-16 LAB — LIPID PANEL
CHOL/HDL RATIO: 2.3 ratio (ref 0.0–5.0)
Cholesterol, Total: 170 mg/dL (ref 100–199)
HDL: 73 mg/dL (ref >39)
LDL CALC: 88 mg/dL (ref 0–99)
TRIGLYCERIDES: 43 mg/dL (ref 0–149)
VLDL CHOLESTEROL CAL: 9 mg/dL (ref 5–40)

## 2018-11-17 MED ORDER — ATORVASTATIN CALCIUM 10 MG PO TABS: 10.0000 mg | ORAL_TABLET | Freq: Every day | ORAL | 3 refills | Status: AC

## 2018-11-17 NOTE — Telephone Encounter (Signed)
Rx(s) sent to pharmacy electronically. Message sent to nurse to order labs when approximately due

## 2018-12-06 NOTE — Telephone Encounter (Signed)
Received a mychart notification stating pt has not yet read message listed below. Call pt and he report he did get message and has been splitting the 20 mg of  Lipitor as instructed. Pt also mentioned since he is now back taking the medication if Dr. Loletha Grayer would like for him to have lipids drawn prior to his scheduled appointment in Sept. Will route to MD       From  Sanda Klein, MD To  Jerry Dimes "Bill" Sent and Delivered  11/22/2018 10:51 AM  You can go ahead and take a half tablet of the 20 mg daily. That will last you for two and a half months, before picking up the new prescription. Don't worry if the tablets do not split perfectly in half. The medication is long lasting and the effect will even itself out.  Dr. Loletha Grayer

## 2019-01-17 ENCOUNTER — Other Ambulatory Visit: Payer: Self-pay | Admitting: *Deleted

## 2019-01-17 DIAGNOSIS — E7800 Pure hypercholesterolemia, unspecified: Secondary | ICD-10-CM

## 2019-02-05 DIAGNOSIS — E7800 Pure hypercholesterolemia, unspecified: Secondary | ICD-10-CM

## 2019-02-17 ENCOUNTER — Encounter

## 2019-02-17 ENCOUNTER — Telehealth: Payer: Medicare Other | Admitting: Cardiovascular Disease

## 2019-03-02 LAB — LIPID PANEL
Chol/HDL Ratio: 2 ratio (ref 0.0–5.0)
Cholesterol, Total: 145 mg/dL (ref 100–199)
HDL: 74 mg/dL (ref >39)
LDL Chol Calc (NIH): 61 mg/dL (ref 0–99)
Triglycerides: 40 mg/dL (ref 0–149)
VLDL Cholesterol Cal: 10 mg/dL (ref 5–40)

## 2019-03-03 ENCOUNTER — Telehealth: Payer: Self-pay | Admitting: Cardiovascular Disease

## 2019-03-03 NOTE — Telephone Encounter (Signed)
New Message:    Pt has an appointment with Dr C on 03-08-19. He wants his wife to come in with him for his appointment.

## 2019-03-03 NOTE — Telephone Encounter (Signed)
Routed to primary RN

## 2019-03-03 NOTE — Telephone Encounter (Signed)
The patient has been made aware that this would be fine and they would both have to wear masks.

## 2019-03-08 ENCOUNTER — Ambulatory Visit (INDEPENDENT_AMBULATORY_CARE_PROVIDER_SITE_OTHER): Payer: Medicare Other | Admitting: Cardiovascular Disease

## 2019-03-08 ENCOUNTER — Encounter: Payer: Self-pay | Admitting: Cardiovascular Disease

## 2019-03-08 ENCOUNTER — Other Ambulatory Visit: Payer: Self-pay

## 2019-03-08 VITALS — BP 116/62 | HR 55 | Temp 98.2°F | Ht 70.0 in | Wt 130.8 lb

## 2019-03-08 DIAGNOSIS — I9589 Other hypotension: Secondary | ICD-10-CM | POA: Diagnosis not present

## 2019-03-08 DIAGNOSIS — I774 Celiac artery compression syndrome: Secondary | ICD-10-CM

## 2019-03-08 DIAGNOSIS — I2510 Atherosclerotic heart disease of native coronary artery without angina pectoris: Secondary | ICD-10-CM | POA: Diagnosis not present

## 2019-03-08 DIAGNOSIS — R636 Underweight: Secondary | ICD-10-CM

## 2019-03-08 DIAGNOSIS — I341 Nonrheumatic mitral (valve) prolapse: Secondary | ICD-10-CM

## 2019-03-08 DIAGNOSIS — E7800 Pure hypercholesterolemia, unspecified: Secondary | ICD-10-CM

## 2019-03-08 DIAGNOSIS — R911 Solitary pulmonary nodule: Secondary | ICD-10-CM

## 2019-03-08 DIAGNOSIS — I771 Stricture of artery: Secondary | ICD-10-CM

## 2019-03-08 NOTE — Progress Notes (Signed)
Cardiology Office Note:    Date:  03/08/2019   ID:  Tanish Prien, DOB 19-Nov-1935, MRN [id number]  PCP:  Javier Glazier, MD  Cardiologist:  No primary care provider on file.   Referring MD: Javier Glazier, MD   Chief Complaint  Patient presents with  . Coronary Artery Disease    History of Present Illness:    Jerry Roth is a 83 y.o. male with a hx of celiac artery stenosis and single-vessel CAD (OM branch of a very tortuous left circumflex artery) , mild hypercholesterolemia, preserved left ventricular systolic function.  The patient specifically denies any chest pain at rest exertion, dyspnea at rest or with exertion, orthopnea, paroxysmal nocturnal dyspnea, syncope, palpitations, focal neurological deficits, intermittent claudication, lower extremity edema, unexplained weight gain, cough, hemoptysis or wheezing.  His wife occasionally notices that he has to catch his breath when speaking in longer sentences, but he denies dyspnea when he exercises including climbing the hill in his neighborhood.  His weight loss has stabilized but he remains extremely lean.  He is very careful with a heart healthy diet, possibly to excess.  He has a flaccid urinary bladder needs to perform intermittent catheterization.  He has frequent urinary tract infections and is on chronic antibiotic suppression.  His most recent lipid profile showed an LDL cholesterol of 61 with an excellent HDL of 74.  He has moved to [facility] and has a new primary care provider there.  He is planning to find a cardiology specialist.  He has severe celiac artery stenosis with a widely patent superior and inferior mesenteric arteries and without intestinal angina.  There was no renal artery stenosis.  His echocardiogram showed normal left ventricular systolic function with EF 60-65% and mild prolapse of the posterior mitral leaflet with mild to moderate regurgitation and a mildly dilated left  atrium.  Incidental findings which were also described on his noncardiac CT from June include the area of ground glass opacities with some nodularity in the lingula, bilateral apical lung scarring and a 2 cm nodule in the right lobe of the thyroid.  As far as I can tell these are unchanged, nonprogressive findings.  He is originally from [place].  There is no family history of coronary disease.  His father had a cardiac arrest after undergoing cholecystectomy.  His sister underwent surgery for 2 heart valves (1 repaired, 1 replaced) about a year ago.  Past Medical History:  Diagnosis Date  . Celiac artery stenosis ([place])   . Multiple thyroid nodules    Benign on biopsy    Past Surgical History:  Procedure Laterality Date  . HERNIA REPAIR     x3  . HERNIA REPAIR     x 3  . KNEE SURGERY    . LEFT HEART CATH AND CORONARY ANGIOGRAPHY N/A 04/20/2018   Procedure: LEFT HEART CATH AND CORONARY ANGIOGRAPHY;  Surgeon: Martinique, Peter M, MD;  Location: [facility];  Service: Cardiovascular;  Laterality: N/A;  . SHOULDER SURGERY    . SHOULDER SURGERY Right 2016  . TONSILLECTOMY AND ADENOIDECTOMY     83 yrs old  . TONSILLECTOMY AND ADENOIDECTOMY     Childhood    Current Medications: Current Meds  Medication Sig  . Cholecalciferol (VITAMIN D-3) 125 MCG (5000 UT) TABS Take 1 tablet by mouth daily.     Allergies:   Lactose and Shellfish allergy   Social History   Socioeconomic History  . Marital status: Married  Spouse name: Not on file  . Number of children: Not on file  . Years of education: Not on file  . Highest education level: Not on file  Occupational History  . Not on file  Social Needs  . Financial resource strain: Not on file  . Food insecurity    Worry: Not on file    Inability: Not on file  . Transportation needs    Medical: Not on file    Non-medical: Not on file  Tobacco Use  . Smoking status: Never Smoker  . Smokeless tobacco: Never Used  Substance and  Sexual Activity  . Alcohol use: Yes    Comment: occ  . Drug use: Never  . Sexual activity: Not on file  Lifestyle  . Physical activity    Days per week: Not on file    Minutes per session: Not on file  . Stress: Not on file  Relationships  . Social Musician on phone: Not on file    Gets together: Not on file    Attends religious service: Not on file    Active member of club or organization: Not on file    Attends meetings of clubs or organizations: Not on file    Relationship status: Not on file  Other Topics Concern  . Not on file  Social History Narrative  . Not on file     Family History: The patient's family history includes Cancer - Colon in his mother; Gallbladder disease in his father; Heart attack in his father; Hypertension in his father and mother.  ROS:   Please see the history of present illness.    All of the systems are reviewed and are negative EKGs/Labs/Other Studies Reviewed:    The following studies were reviewed today: Cardiac catheterization 04/20/2018  Mid LAD lesion is 50% stenosed.  Prox Cx to Mid Cx lesion is 70% stenosed.  Ost 1st Mrg lesion is 85% stenosed.  Lat 1st Mrg lesion is 70% stenosed.  The left ventricular systolic function is normal.  LV end diastolic pressure is normal.  The left ventricular ejection fraction is 55-65% by visual estimate.   1. Single vessel obstructive CAD involving a large first OM. This correlates well with CTA/FFR results.  2. Normal LV function 3. Normal LVEDP  Plan: While the OM lesion may be hemodynamically significant I would recommend medical therapy at this point. He is asymptomatic and has single vessel disease with normal LV function. The OM lesion was very difficult to visualize due to vessel tortuosity and overlap despite multiple angulated views. This would make PCI very challenging.   Recommend Aspirin 81mg  daily for moderate CAD.  EKG:  EKG is ordered today.  It shows mild  sinus bradycardia but is otherwise a normal tracing.  QTc 419 ms  Recent Labs: 04/19/2018: BUN 15; Creatinine, Ser 1.00; Hemoglobin 13.0; Platelets 154; Potassium 4.3; Sodium 140 06/29/2018: ALT 44  Recent Lipid Panel Lipid Panel     Component Value Date/Time   CHOL 145 03/01/2019 1109   TRIG 40 03/01/2019 1109   HDL 74 03/01/2019 1109   CHOLHDL 2.0 03/01/2019 1109   LDLCALC 61      Physical Exam:    VS:  BP 116/62   Pulse (!) 55   Temp 98.2 F (36.8 C) (Temporal)   Ht 5\' 10"  (1.778 m)   Wt 130 lb 12.8 oz (59.3 kg)   SpO2 97%   BMI 18.77 kg/m  Wt Readings from Last 3 Encounters:  03/08/19 130 lb 12.8 oz (59.3 kg)  07/25/18 125 lb (56.7 kg)  06/20/18 128 lb (58.1 kg)      General: Alert, oriented x3, no distress, extremely slender Head: no evidence of trauma, PERRL, EOMI, no exophtalmos or lid lag, no myxedema, no xanthelasma; normal ears, nose and oropharynx Neck: normal jugular venous pulsations and no hepatojugular reflux; brisk carotid pulses without delay and no carotid bruits Chest: clear to auscultation, no signs of consolidation by percussion or palpation, normal fremitus, symmetrical and full respiratory excursions Cardiovascular: normal position and quality of the apical impulse, regular rhythm, normal first and second heart sounds, distinct midsystolic click and late systolic apical murmur, both intensify with the Valsalva maneuver, no diastolic murmurs, rubs or gallops Abdomen: no tenderness or distention, no masses by palpation, no abnormal pulsatility or arterial bruits, normal bowel sounds, no hepatosplenomegaly Extremities: no clubbing, cyanosis or edema; 2+ radial, ulnar and brachial pulses bilaterally; 2+ right femoral, posterior tibial and dorsalis pedis pulses; 2+ left femoral, posterior tibial and dorsalis pedis pulses; no subclavian or femoral bruits Neurological: grossly nonfocal Psych: Normal mood and affect     ASSESSMENT:    1. Coronary  artery disease involving native coronary artery of native heart without angina pectoris   2. Other specified hypotension   3. Celiac artery stenosis (HCC)   4. Underweight   5. Mitral valve prolapse   6. Hypercholesterolemia   7. Pulmonary nodule seen on imaging study    PLAN:    In order of problems listed above:  1. CAD: Single-vessel disease, no angina at rest or with activity.  Medical therapy is recommended.  [place] and we will gladly send the records to them. 2. Hypotension: Often has low blood pressure, although today has normal values.  Has never describe symptoms of hypotension. 3. Celiac artery stenosis: This is severe but he does not have intestinal angina and has no evidence of concurrent superior mesenteric artery stenosis.  His weight loss has stabilized.  He does not have other symptoms to suggest malabsorption.  I think he needs to be a little more liberal with his food intake.   4. Underweight: Remains unexplained but has stabilized.  His wife raises the concern for celiac disease.  Jerry Roth reports that he thinks he had a small bowel biopsy that was negative. 5. MVP/MR: Remains asymptomatic.  He has mild mitral valve prolapse with mild-moderate mitral insufficiency, no indication for serial echocardiograms unless there is evidence of new cardiomegaly, his murmur changes or he has a clinical manifestations of mitral insufficiency (dyspnea). 6. HLP: He is at target LDL less than 70 on current low-dose atorvastatin 7. Pulmonary nodule: Per the radiology recommendations he should have a repeat CT of the chest to reevaluate the groundglass opacity and subcentimeter nodule in the lingula .  I suspect he will [place]   Medication Adjustments/Labs and Tests Ordered: Current medicines are reviewed at length with the patient today.  Concerns regarding medicines are outlined above.  Orders Placed This  Encounter  Procedures  . EKG 12-Lead   No orders of the defined types were placed in this encounter.   Patient Instructions  Medication Instructions:  Your physician recommends that you continue on your current medications as directed. Please refer to the Current Medication list given to you today.  If you need a refill on your cardiac medications before your  next appointment, please call your pharmacy.   Lab work: None ordered If you have labs (blood work) drawn today and your tests are completely normal, you will receive your results only by: MyChart Message (if you have MyChart) OR A paper copy in the mail If you have any lab test that is abnormal or we need to change your treatment, we will call you to review the results.  Testing/Procedures: None ordered  Follow-Up: Follow up as needed with Dr. Royann Shivers         Signed, Thurmon Fair, MD  03/08/2019 12:35 PM    [facility]\

## 2019-03-08 NOTE — Patient Instructions (Signed)
Medication Instructions:  Your physician recommends that you continue on your current medications as directed. Please refer to the Current Medication list given to you today.  If you need a refill on your cardiac medications before your next appointment, please call your pharmacy.   Lab work: None ordered If you have labs (blood work) drawn today and your tests are completely normal, you will receive your results only by: MyChart Message (if you have MyChart) OR A paper copy in the mail If you have any lab test that is abnormal or we need to change your treatment, we will call you to review the results.  Testing/Procedures: None ordered  Follow-Up: Follow up as needed with Dr. Croitoru.
